# Patient Record
Sex: Female | Born: 1960 | Race: White | Hispanic: No | Marital: Married | Smoking: Never smoker
Health system: Southern US, Community
[De-identification: ages and names within clinical notes are randomized; demographics above are authoritative.]

## PROBLEM LIST (undated history)

## (undated) DIAGNOSIS — N393 Stress incontinence (female) (male): Secondary | ICD-10-CM

## (undated) DIAGNOSIS — R011 Cardiac murmur, unspecified: Secondary | ICD-10-CM

## (undated) DIAGNOSIS — Z8619 Personal history of other infectious and parasitic diseases: Secondary | ICD-10-CM

## (undated) HISTORY — DX: Cardiac murmur, unspecified: R01.1

## (undated) HISTORY — PX: NO PAST SURGERIES: SHX2092

## (undated) HISTORY — DX: Personal history of other infectious and parasitic diseases: Z86.19

## (undated) HISTORY — DX: Stress incontinence (female) (male): N39.3

---

## 2007-12-07 ENCOUNTER — Ambulatory Visit: Payer: Self-pay | Admitting: Unknown Physician Specialty

## 2009-10-29 ENCOUNTER — Ambulatory Visit: Payer: Self-pay | Admitting: Unknown Physician Specialty

## 2011-04-21 ENCOUNTER — Ambulatory Visit: Payer: Self-pay

## 2012-02-01 ENCOUNTER — Ambulatory Visit: Payer: Self-pay | Admitting: Family Medicine

## 2012-05-29 ENCOUNTER — Encounter: Payer: Self-pay | Admitting: Internal Medicine

## 2012-05-29 ENCOUNTER — Ambulatory Visit (INDEPENDENT_AMBULATORY_CARE_PROVIDER_SITE_OTHER): Payer: BC Managed Care – PPO | Admitting: Internal Medicine

## 2012-05-29 ENCOUNTER — Other Ambulatory Visit (HOSPITAL_COMMUNITY)
Admission: RE | Admit: 2012-05-29 | Discharge: 2012-05-29 | Disposition: A | Discharge status: Home / Self Care | Payer: BC Managed Care – PPO | Source: Ambulatory Visit | Attending: Internal Medicine | Admitting: Internal Medicine

## 2012-05-29 VITALS — BP 120/76 | HR 64 | Temp 98.4°F | Ht 66.0 in | Wt 138.5 lb

## 2012-05-29 DIAGNOSIS — Z806 Family history of leukemia: Secondary | ICD-10-CM

## 2012-05-29 DIAGNOSIS — R32 Unspecified urinary incontinence: Secondary | ICD-10-CM

## 2012-05-29 DIAGNOSIS — R5381 Other malaise: Secondary | ICD-10-CM

## 2012-05-29 DIAGNOSIS — Z13220 Encounter for screening for lipoid disorders: Secondary | ICD-10-CM

## 2012-05-29 DIAGNOSIS — Z1211 Encounter for screening for malignant neoplasm of colon: Secondary | ICD-10-CM

## 2012-05-29 DIAGNOSIS — Z1239 Encounter for other screening for malignant neoplasm of breast: Secondary | ICD-10-CM

## 2012-05-29 DIAGNOSIS — R5383 Other fatigue: Secondary | ICD-10-CM

## 2012-05-29 DIAGNOSIS — Z01419 Encounter for gynecological examination (general) (routine) without abnormal findings: Secondary | ICD-10-CM | POA: Insufficient documentation

## 2012-05-29 DIAGNOSIS — Z0000 Encounter for general adult medical examination without abnormal findings: Secondary | ICD-10-CM

## 2012-05-29 LAB — POCT URINALYSIS DIPSTICK
Glucose, UA: NEGATIVE
Nitrite, UA: NEGATIVE
Protein, UA: NEGATIVE
Spec Grav, UA: 1.02
Urobilinogen, UA: 0.2

## 2012-05-29 NOTE — Progress Notes (Signed)
Patient ID: Lucilla Lame, female   DOB: October 24, 1960, 51 y.o.   MRN: [id number]  Subjective:    Marylen Zuk is a 51 y.o. female who presents for an annual exam. The patient has no complaints today. The patient is sexually active. GYN screening history: last pap: approximate date 2011 and was normal. The patient wears seatbelts: yes. The patient participates in regular exercise: yes. Has the patient ever been transfused or tattooed?: no. The patient reports that there is not domestic violence in her life.   Menstrual History: OB History    Grav Para Term Preterm Abortions TAB SAB Ect Mult Living                  Menarche age: 89 Patient's last menstrual period was 05/22/2012.    The following portions of the patient's history were reviewed and updated as appropriate: allergies, current medications, past family history, past medical history, past social history, past surgical history and problem list.  Review of Systems A comprehensive review of systems was negative.    Objective:    BP 120/76  Pulse 64  Temp 98.4 F (36.9 C) (Oral)  Ht 5\' 6"  (1.676 m)  Wt 138 lb 8 oz (62.823 kg)  BMI 22.35 kg/m2  LMP 05/22/2012  General Appearance:    Alert, cooperative, no distress, appears stated age  Head:    Normocephalic, without obvious abnormality, atraumatic  Eyes:    PERRL, conjunctiva/corneas clear, EOM's intact, fundi    benign, both eyes  Ears:    Normal TM's and external ear canals, both ears  Nose:   Nares normal, septum midline, mucosa normal, no drainage    or sinus tenderness  Throat:   Lips, mucosa, and tongue normal; teeth and gums normal  Neck:   Supple, symmetrical, trachea midline, no adenopathy;    thyroid:  no enlargement/tenderness/nodules; no carotid   bruit or JVD  Back:     Symmetric, no curvature, ROM normal, no CVA tenderness  Lungs:     Clear to auscultation bilaterally, respirations unlabored  Chest Wall:    No tenderness or deformity   Heart:    Regular  rate and rhythm, S1 and S2 normal, no murmur, rub   or gallop  Breast Exam:    No tenderness, masses, or nipple abnormality  Abdomen:     Soft, non-tender, bowel sounds active all four quadrants,    no masses, no organomegaly  Genitalia:    Normal female without lesion, discharge or tenderness  Rectal:    Normal tone, normal prostate, no masses or tenderness;   guaiac negative stool  Extremities:   Extremities normal, atraumatic, no cyanosis or edema  Pulses:   2+ and symmetric all extremities  Skin:   Skin color, texture, turgor normal, no rashes or lesions  Lymph nodes:   Cervical, supraclavicular, and axillary nodes normal  Neurologic:   CNII-XII intact, normal strength, sensation and reflexes    throughout  .    Assessment:    Healthy female exam.    Plan:     Await pap smear results. Blood tests: CBC with diff, Comprehensive metabolic panel, Lipoproteins and TSH. Breast self exam technique reviewed and patient encouraged to perform self-exam monthly. Follow up in 1 year.

## 2012-05-29 NOTE — Patient Instructions (Signed)
Please make an appointment to return for fasting blood work. We will call you with the results of your Pap smear.  A referral for screening colonoscopy is underway.  Your mammogram has been ordered.

## 2012-05-30 LAB — URINALYSIS
Leukocytes, UA: NEGATIVE
Nitrite: NEGATIVE
Specific Gravity, Urine: 1.015 (ref 1.000–1.030)
Urobilinogen, UA: 0.2 (ref 0.0–1.0)
pH: 6 (ref 5.0–8.0)

## 2012-06-05 ENCOUNTER — Encounter: Payer: Self-pay | Admitting: Family Medicine

## 2012-06-05 ENCOUNTER — Other Ambulatory Visit (INDEPENDENT_AMBULATORY_CARE_PROVIDER_SITE_OTHER): Payer: BC Managed Care – PPO

## 2012-06-05 DIAGNOSIS — R5381 Other malaise: Secondary | ICD-10-CM

## 2012-06-05 DIAGNOSIS — Z806 Family history of leukemia: Secondary | ICD-10-CM

## 2012-06-05 DIAGNOSIS — Z13220 Encounter for screening for lipoid disorders: Secondary | ICD-10-CM

## 2012-06-05 DIAGNOSIS — R5383 Other fatigue: Secondary | ICD-10-CM

## 2012-06-05 LAB — COMPREHENSIVE METABOLIC PANEL
ALT: 16 U/L (ref 0–35)
AST: 19 U/L (ref 0–37)
Albumin: 4 g/dL (ref 3.5–5.2)
CO2: 25 mEq/L (ref 19–32)
Calcium: 9.2 mg/dL (ref 8.4–10.5)
Chloride: 102 mEq/L (ref 96–112)
GFR: 114.49 mL/min (ref >60.00)
Potassium: 4 mEq/L (ref 3.5–5.1)
Sodium: 136 mEq/L (ref 135–145)
Total Protein: 6.7 g/dL (ref 6.0–8.3)

## 2012-06-05 LAB — CBC WITH DIFFERENTIAL/PLATELET
Basophils Relative: 0.6 % (ref 0.0–3.0)
HCT: 37.5 % (ref 36.0–46.0)
Hemoglobin: 12.5 g/dL (ref 12.0–15.0)
Lymphocytes Relative: 32.7 % (ref 12.0–46.0)
Lymphs Abs: 1.3 10*3/uL (ref 0.7–4.0)
Monocytes Relative: 7.1 % (ref 3.0–12.0)
Neutro Abs: 2.3 10*3/uL (ref 1.4–7.7)
RBC: 3.88 Mil/uL (ref 3.87–5.11)
RDW: 13.3 % (ref 11.5–14.6)

## 2012-06-05 LAB — LIPID PANEL
Cholesterol: 194 mg/dL (ref 0–200)
LDL Cholesterol: 78 mg/dL (ref 0–99)
Total CHOL/HDL Ratio: 2
Triglycerides: 37 mg/dL (ref 0.0–149.0)

## 2012-06-05 LAB — TSH: TSH: 1.04 u[IU]/mL (ref 0.35–5.50)

## 2012-07-05 ENCOUNTER — Ambulatory Visit: Payer: Self-pay | Admitting: Internal Medicine

## 2012-07-14 ENCOUNTER — Encounter: Payer: Self-pay | Admitting: Internal Medicine

## 2012-09-11 LAB — HM COLONOSCOPY: HM Colonoscopy: NORMAL

## 2012-09-13 ENCOUNTER — Ambulatory Visit: Payer: Self-pay | Admitting: General Surgery

## 2012-10-11 ENCOUNTER — Encounter: Payer: Self-pay | Admitting: Internal Medicine

## 2013-05-30 ENCOUNTER — Ambulatory Visit (INDEPENDENT_AMBULATORY_CARE_PROVIDER_SITE_OTHER): Payer: BC Managed Care – PPO | Admitting: Internal Medicine

## 2013-05-30 ENCOUNTER — Encounter: Payer: Self-pay | Admitting: Internal Medicine

## 2013-05-30 VITALS — BP 112/72 | HR 70 | Temp 98.6°F | Resp 14 | Ht 66.1 in | Wt 141.2 lb

## 2013-05-30 DIAGNOSIS — R5381 Other malaise: Secondary | ICD-10-CM

## 2013-05-30 DIAGNOSIS — Z23 Encounter for immunization: Secondary | ICD-10-CM

## 2013-05-30 DIAGNOSIS — N63 Unspecified lump in unspecified breast: Secondary | ICD-10-CM

## 2013-05-30 DIAGNOSIS — Z0000 Encounter for general adult medical examination without abnormal findings: Secondary | ICD-10-CM

## 2013-05-30 DIAGNOSIS — R635 Abnormal weight gain: Secondary | ICD-10-CM

## 2013-05-30 LAB — COMPREHENSIVE METABOLIC PANEL
ALT: 16 U/L (ref 0–35)
BUN: 19 mg/dL (ref 6–23)
CO2: 26 mEq/L (ref 19–32)
Calcium: 9.7 mg/dL (ref 8.4–10.5)
Chloride: 102 mEq/L (ref 96–112)
Creatinine, Ser: 0.6 mg/dL (ref 0.4–1.2)
GFR: 111.85 mL/min (ref >60.00)
Glucose, Bld: 100 mg/dL — ABNORMAL HIGH (ref 70–99)
Total Bilirubin: 0.9 mg/dL (ref 0.3–1.2)

## 2013-05-30 LAB — CBC WITH DIFFERENTIAL/PLATELET
Basophils Relative: 0.5 % (ref 0.0–3.0)
Eosinophils Absolute: 0 10*3/uL (ref 0.0–0.7)
Eosinophils Relative: 0.9 % (ref 0.0–5.0)
HCT: 37.4 % (ref 36.0–46.0)
Lymphocytes Relative: 26.2 % (ref 12.0–46.0)
Lymphs Abs: 1.4 10*3/uL (ref 0.7–4.0)
MCHC: 34.2 g/dL (ref 30.0–36.0)
MCV: 95.7 fl (ref 78.0–100.0)
Monocytes Absolute: 0.4 10*3/uL (ref 0.1–1.0)
Platelets: 247 10*3/uL (ref 150.0–400.0)
RDW: 13.2 % (ref 11.5–14.6)
WBC: 5.2 10*3/uL (ref 4.5–10.5)

## 2013-05-30 LAB — TSH: TSH: 0.79 u[IU]/mL (ref 0.35–5.50)

## 2013-05-30 NOTE — Patient Instructions (Addendum)
You had your annual wellness exam today  We will schedule your mammogram today  You received the  TDaP vaccine   We will contact you with the bloodwork results  This is  myversion of a  "Low GI"  Diet:      All of the foods can be found at grocery stores and in bulk at [employer].  The Atkins protein bars and shakes are available in more varieties at Target, WalMart and [employer]'s Foods.     7 AM Breakfast:  Choose from the following:  Low carbohydrate Protein  Shakes (I recommend the EAS AdvantEdge "Carb Control" shakes  Or the low carb shakes by Atkins.    2.5 carbs   Arnold's "Sandwhich Thin"toasted  w/ peanut butter (no jelly: about 20 net carbs  "Bagel Thin" with cream cheese and salmon: about 20 carbs   a scrambled egg/bacon/cheese burrito made with Mission's "carb balance" whole wheat tortilla  (about 10 net carbs )   Avoid cereal and bananas, oatmeal and cream of wheat and grits. They are loaded with carbohydrates!   10 AM: high protein snack  Protein bar by Atkins (the snack size, under 200 cal, usually < 6 net carbs).    A stick of cheese:  Around 1 carb,  100 cal     Dannon Light n Fit [place] Yogurt  (80 cal, 8 carbs)  Other so called "protein bars" and Greek yogurts tend to be loaded with carbohydrates.  Remember, in food advertising, the word "energy" is synonymous for " carbohydrate."  Lunch:   A Sandwich using the bread choices listed, Can use any  Eggs,  lunchmeat, grilled meat or canned tuna), avocado, regular mayo/mustard  and cheese.  A Salad using blue cheese, ranch,  Goddess or vinagrette,  No croutons or "confetti" and no "candied nuts" but regular nuts OK.   No pretzels or chips.  Pickles and miniature sweet peppers are a good low carb alternative that provide a "crunch"  The bread is the only source of carbohydrate in a sandwich and  can be decreased by trying some of these alternatives to traditional loaf bread  Joseph's makes a pita bread and a flat bread that  are 50 cal and 4 net carbs available at BJs and WalMart.  This can be toasted to use with hummous as well  Toufayan makes a low carb flatbread that's 100 cal and 9 net carbs available at [employer] and [employer] makes 2 sizes of  Low carb whole wheat tortilla  (The large one is 210 cal and 6 net carbs) Avoid "Low fat dressings, as well as Reyne Dumas and [address] dressings They are loaded with sugar!   3 PM/ Mid day  Snack:  Consider  1 ounce of  almonds, walnuts, pistachios, pecans, peanuts,  Macadamia nuts or a nut medley.  Avoid "granola"; the dried cranberries and raisins are loaded with carbohydrates. Mixed nuts as long as there are no raisins,  cranberries or dried fruit.     6 PM  Dinner:     Meat/fowl/fish with a green salad, and either broccoli, cauliflower, green beans, spinach, brussel sprouts or  Lima beans. DO NOT BREAD THE PROTEIN!!      There is a low carb pasta by Dreamfield's that is acceptable and tastes great: only 5 digestible carbs/serving.( All grocery stores but BJs carry it )  Try Kai Levins Angelo's chicken piccata or chicken or eggplant parm over low carb pasta.(Lowes and BJs)  Clifton Custard Sanchez's "Carnitas" (pulled pork, no sauce,  0 carbs) or his beef pot roast to make a dinner burrito (at BJ's)  Pesto over low carb pasta (bj's sells a good quality pesto in the center refrigerated section of the deli   Whole wheat pasta is still full of digestible carbs and  Not as low in glycemic index as Dreamfield's.   Brown rice is still rice,  So skip the rice and noodles if you eat [place] or [place] (or at least limit to 1/2 cup)  9 PM snack :   Breyer's "low carb" fudgsicle or  ice cream bar (Carb Smart line), or  Weight Watcher's ice cream bar , or another "no sugar added" ice cream;  a serving of fresh berries/cherries with whipped cream   Cheese or DANNON'S LlGHT N FIT [place] YOGURT  Avoid bananas, pineapple, grapes  and watermelon on a regular basis because they are high  in sugar.  THINK OF THEM AS DESSERT  Remember that snack Substitutions should be less than 10 NET carbs per serving and meals < 20 carbs. Remember to subtract fiber grams to get the "net carbs."

## 2013-05-30 NOTE — Progress Notes (Signed)
Patient ID: Nancy Villegas, female   DOB: 1961/07/02, 52 y.o.   MRN: [id number]   Subjective:     Nancy Villegas is a 52 y.o. female and is here for a comprehensive physical exam. The patient reports. Annual exam.  Chief complaint today is dissatisfaction with her weight and body shape despite regular exercise and healthy diet. She states that her periods have been occurring regularly. She has begun to have occasional hot flashes and night sweats. She is running 30 minutes 5 days a week and using weight training 4 days a week. She exercises daily. He follows what she considers to be a low glycemic index diet.        History   Social History  . Marital Status: Married    Spouse Name: N/A    Number of Children: N/A  . Years of Education: N/A   Occupational History  . Not on file.   Social History Main Topics  . Smoking status: Never Smoker   . Smokeless tobacco: Not on file  . Alcohol Use: Yes     Comment: Regular (1 beer/daily)  . Drug Use: No  . Sexual Activity: Not on file   Other Topics Concern  . Not on file   Social History Narrative  . No narrative on file   Health Maintenance  Topic Date Due  . Influenza Vaccine  04/06/2014  . Mammogram  07/05/2014  . Pap Smear  05/30/2015  . Colonoscopy  09/13/2022  . Tetanus/tdap  05/31/2023    The following portions of the patient's history were reviewed and updated as appropriate: allergies, current medications, past family history, past medical history, past social history, past surgical history and problem list.  Review of Systems A comprehensive review of systems was negative.   Objective:  BP 112/72  Pulse 70  Temp(Src) 98.6 F (37 C) (Oral)  Resp 14  Ht 5' 6.1" (1.679 m)  Wt 141 lb 4 oz (64.071 kg)  BMI 22.73 kg/m2  SpO2 99%  LMP 05/09/2013  General Appearance:    Alert, cooperative, no distress, appears stated age  Head:    Normocephalic, without obvious abnormality, atraumatic  Eyes:    PERRL,  conjunctiva/corneas clear, EOM's intact, fundi    benign, both eyes  Ears:    Normal TM's and external ear canals, both ears  Nose:   Nares normal, septum midline, mucosa normal, no drainage    or sinus tenderness  Throat:   Lips, mucosa, and tongue normal; teeth and gums normal  Neck:   Supple, symmetrical, trachea midline, no adenopathy;    thyroid:  no enlargement/tenderness/nodules; no carotid   bruit or JVD  Back:     Symmetric, no curvature, ROM normal, no CVA tenderness  Lungs:     Clear to auscultation bilaterally, respirations unlabored  Chest Wall:    No tenderness or deformity   Heart:    Regular rate and rhythm, S1 and S2 normal, no murmur, rub   or gallop  Breast Exam:    Breasts are dense and lumpy,  Pea sized nodule at 5:00 postion left breast noted. No dimpling or nipple abnormality  Abdomen:     Soft, non-tender, bowel sounds active all four quadrants,    no masses, no organomegaly     Extremities:   Extremities normal, atraumatic, no cyanosis or edema  Pulses:   2+ and symmetric all extremities  Skin:   Skin color, texture, turgor normal, no rashes or lesions  Lymph nodes:  Cervical, supraclavicular, and axillary nodes normal  Neurologic:   CNII-XII intact, normal strength, sensation and reflexes    throughout    Assessment:    Well woman exam (no gynecological exam) Annual comprehensive exam was done including breas exam.  Pelvic and PAP were normal in 2013 and not repeated .  All screenings have been addressed .   Breast mass in female She has a pea-sized mass in the right breast at the 5:00 position that is not present on the left. I am ordering her diagnostic mammogram today.   Updated Medication List No outpatient encounter prescriptions on file as of 05/30/2013.   No facility-administered encounter medications on file as of 05/30/2013.

## 2013-05-31 ENCOUNTER — Encounter: Payer: Self-pay | Admitting: Internal Medicine

## 2013-05-31 DIAGNOSIS — Z0000 Encounter for general adult medical examination without abnormal findings: Secondary | ICD-10-CM | POA: Insufficient documentation

## 2013-05-31 DIAGNOSIS — N63 Unspecified lump in unspecified breast: Secondary | ICD-10-CM | POA: Insufficient documentation

## 2013-05-31 NOTE — Assessment & Plan Note (Signed)
Annual comprehensive exam was done including breas exam.  Pelvic and PAP were normal in 2013 and not repeated .  All screenings have been addressed .

## 2013-05-31 NOTE — Assessment & Plan Note (Signed)
She has a pea-sized mass in the right breast at the 5:00 position that is not present on the left. I am ordering her diagnostic mammogram today.

## 2013-06-04 NOTE — Telephone Encounter (Signed)
Mailed unread message to pt  

## 2013-06-08 ENCOUNTER — Ambulatory Visit: Payer: Self-pay | Admitting: Internal Medicine

## 2013-06-23 ENCOUNTER — Encounter: Payer: Self-pay | Admitting: Internal Medicine

## 2013-07-09 ENCOUNTER — Telehealth: Payer: Self-pay | Admitting: *Deleted

## 2013-07-09 NOTE — Telephone Encounter (Signed)
Patient called to schedule a visit to discuss Hot flashes.

## 2013-07-13 ENCOUNTER — Encounter: Payer: Self-pay | Admitting: Internal Medicine

## 2013-07-13 ENCOUNTER — Ambulatory Visit (INDEPENDENT_AMBULATORY_CARE_PROVIDER_SITE_OTHER): Payer: BC Managed Care – PPO | Admitting: Internal Medicine

## 2013-07-13 VITALS — BP 126/68 | HR 80 | Temp 98.1°F | Resp 12 | Ht 66.0 in | Wt 137.2 lb

## 2013-07-13 DIAGNOSIS — N959 Unspecified menopausal and perimenopausal disorder: Secondary | ICD-10-CM

## 2013-07-13 DIAGNOSIS — N951 Menopausal and female climacteric states: Secondary | ICD-10-CM

## 2013-07-13 MED ORDER — PROGESTERONE MICRONIZED 200 MG PO CAPS: 200.0000 mg | ORAL_CAPSULE | Freq: Every day | ORAL | Status: DC

## 2013-07-13 MED ORDER — ESTRADIOL 0.1 MG/24HR TD PTTW: 1.0000 | MEDICATED_PATCH | TRANSDERMAL | Status: DC

## 2013-07-13 NOTE — Patient Instructions (Signed)
I am prescribing HRT (hormone replacement therapy) for your hot flashes  You have been given samples of the minivelle patch which contains only estrogen You will need to take Prometrium,   The progesterone pill,  In addition to wearing the patch,  For 12 days out of every month to prevent endometrial hyperplasia which increases your risk for endometrial cancer

## 2013-07-13 NOTE — Progress Notes (Signed)
Patient ID: Nancy Villegas, female   DOB: [birth year], 52 y.o.   MRN: [id number]  Patient Active Problem List   Diagnosis Date Noted  . Menopausal hot flushes 07/14/2013  . Well woman exam (no gynecological exam) 05/31/2013    Subjective:  CC:   Chief Complaint  Patient presents with  . Acute Visit    Hot flashes    HPI:   Nancy Horrell. Parkeris a 52 y.o. female who presents with hot flashes,  Having at least 5 per night,  Disrupting her sleep.  She has been miserable for the last 2 months,  Last menses was 2 months ago.  No contraindications ot HRT and wants to try HRT>  Friend recommended minivelle transdermal.    Past Medical History  Diagnosis Date  . History of chicken pox   . Heart murmur   . Urine, incontinence, stress female     leaking with laughter or very full bladder    No past surgical history on file.     The following portions of the patient's history were reviewed and updated as appropriate: Allergies, current medications, and problem list.    Review of Systems:   12 Pt  review of systems was negative except those addressed in the HPI,     History   Social History  . Marital Status: Married    Spouse Name: N/A    Number of Children: N/A  . Years of Education: N/A   Occupational History  . Not on file.   Social History Main Topics  . Smoking status: Never Smoker   . Smokeless tobacco: Not on file  . Alcohol Use: Yes     Comment: Regular (1 beer/daily)  . Drug Use: No  . Sexual Activity: Not on file   Other Topics Concern  . Not on file   Social History Narrative  . No narrative on file    Objective:  Filed Vitals:   07/13/13 1118  BP: 126/68  Pulse: 80  Temp: 98.1 F (36.7 C)  Resp: 12     General appearance: alert, cooperative and appears stated age Ears: normal TM's and external ear canals both ears Throat: lips, mucosa, and tongue normal; teeth and gums normal Neck: no adenopathy, no carotid bruit, supple, symmetrical,  trachea midline and thyroid not enlarged, symmetric, no tenderness/mass/nodules Back: symmetric, no curvature. ROM normal. No CVA tenderness. Lungs: clear to auscultation bilaterally Heart: regular rate and rhythm, S1, S2 normal, no murmur, click, rub or gallop Abdomen: soft, non-tender; bowel sounds normal; no masses,  no organomegaly Pulses: 2+ and symmetric Skin: Skin color, texture, turgor normal. No rashes or lesions Lymph nodes: Cervical, supraclavicular, and axillary nodes normal.  Assessment and Plan:  Menopausal hot flushes The risks and benefits of HRT were discussed with patient, and alternatives to HRT were also discussed.  She is requesting HRT and has no relative or absolute C/I.  Samples of minivelle and oral progesterone for 12 days/month to prevent enodmetrial hyperplasia.     Updated Medication List Outpatient Encounter Prescriptions as of 07/13/2013  Medication Sig  . estradiol (MINIVELLE) 0.1 MG/24HR patch Place 1 patch (0.1 mg total) onto the skin 2 (two) times a week.  . progesterone (PROMETRIUM) 200 MG capsule Take 1 capsule (200 mg total) by mouth daily. In the evening for 12 days every month  . [DISCONTINUED] progesterone (PROMETRIUM) 200 MG capsule Take 1 capsule (200 mg total) by mouth daily. In the evening for 12 days every month  No orders of the defined types were placed in this encounter.    No Follow-up on file.

## 2013-07-13 NOTE — Progress Notes (Signed)
Pre-visit discussion using our clinic review tool. No additional management support is needed unless otherwise documented below in the visit note.  

## 2013-07-14 DIAGNOSIS — N951 Menopausal and female climacteric states: Secondary | ICD-10-CM | POA: Insufficient documentation

## 2013-07-14 NOTE — Assessment & Plan Note (Signed)
The risks and benefits of HRT were discussed with patient, and alternatives to HRT were also discussed.  She is requesting HRT and has no relative or absolute C/I.  Samples of minivelle and oral progesterone for 12 days/month to prevent enodmetrial hyperplasia.

## 2013-07-23 ENCOUNTER — Encounter: Payer: Self-pay | Admitting: Internal Medicine

## 2013-07-24 ENCOUNTER — Encounter: Payer: Self-pay | Admitting: Internal Medicine

## 2013-07-25 MED ORDER — ESTRADIOL 2 MG PO TABS: 2.0000 mg | ORAL_TABLET | Freq: Every day | ORAL | Status: DC

## 2013-07-25 NOTE — Addendum Note (Signed)
Addended by: Sherlene Shams on: 07/25/2013 06:55 AM   Modules accepted: Orders

## 2013-08-09 ENCOUNTER — Telehealth: Payer: Self-pay | Admitting: Internal Medicine

## 2013-08-09 ENCOUNTER — Encounter: Payer: Self-pay | Admitting: Internal Medicine

## 2013-08-09 NOTE — Telephone Encounter (Signed)
Pt states she needs insurance form filled out by tomorrow.  Did not realize tomorrow was the deadline.  Is faxing over form to be completed.  Would like a call to let her know the status, and if it could possibly be turned in tomorrow.

## 2013-08-09 NOTE — Telephone Encounter (Signed)
I have not seen this paper work unless it is [employer] from lunch.

## 2013-08-10 ENCOUNTER — Telehealth: Payer: Self-pay | Admitting: Internal Medicine

## 2013-08-10 DIAGNOSIS — Z13220 Encounter for screening for lipoid disorders: Secondary | ICD-10-CM

## 2013-08-10 NOTE — Telephone Encounter (Signed)
Faxed form without Lipids. Patient requesting fasting labs, does patient also need an appointment with you please advise.

## 2013-08-10 NOTE — Telephone Encounter (Signed)
Does not need appt.  Order for fasting lipids placed .  Make appt for labs only

## 2013-08-10 NOTE — Telephone Encounter (Signed)
Pt calling to check status of paperwork.  States she will have husband fax it again now.

## 2013-08-10 NOTE — Telephone Encounter (Signed)
Pt left vm.  Regarding paperwork, asking if you will fax it with the blank spots.  States they will ask insurance if they can complete the rest later.  States she would like to make an appt some time next week to complete what is needed to fill in the blanks.

## 2013-08-10 NOTE — Telephone Encounter (Signed)
Paper work needs current LDL and HDL cholesterol profile patient has not had Lipid drawn since 2013.  Patient notified this cannot be done due to no blood work

## 2013-08-10 NOTE — Telephone Encounter (Signed)
Pt dropped off biometric forms Need to be turned in today Took back to cathy

## 2013-08-13 NOTE — Telephone Encounter (Signed)
Patient scheduled for fasting labs 08/16/13

## 2013-08-15 NOTE — Telephone Encounter (Signed)
Holding paper work for labs. Paper work on top of Education officer, environmental in tray.

## 2013-08-16 ENCOUNTER — Other Ambulatory Visit: Payer: BC Managed Care – PPO

## 2013-09-21 NOTE — Telephone Encounter (Signed)
Form awaiting patient blood work

## 2013-12-15 ENCOUNTER — Other Ambulatory Visit: Payer: Self-pay | Admitting: Internal Medicine

## 2014-05-14 ENCOUNTER — Encounter: Payer: Self-pay | Admitting: Internal Medicine

## 2014-05-15 ENCOUNTER — Telehealth: Payer: Self-pay | Admitting: Internal Medicine

## 2014-05-15 NOTE — Telephone Encounter (Signed)
   I am closed to new patients,  but NOT to family members of established patients.  I'm happy to see  her husband as a new patient ; please call her back .  Regards,  Dr. Darrick Huntsman

## 2014-05-31 ENCOUNTER — Encounter: Payer: BC Managed Care – PPO | Admitting: Internal Medicine

## 2014-07-02 ENCOUNTER — Other Ambulatory Visit: Payer: Self-pay | Admitting: Internal Medicine

## 2014-07-02 NOTE — Telephone Encounter (Signed)
Appt 07/08/14 

## 2014-07-08 ENCOUNTER — Ambulatory Visit (INDEPENDENT_AMBULATORY_CARE_PROVIDER_SITE_OTHER): Payer: BC Managed Care – PPO | Admitting: *Deleted

## 2014-07-08 ENCOUNTER — Encounter: Payer: Self-pay | Admitting: Internal Medicine

## 2014-07-08 ENCOUNTER — Ambulatory Visit (INDEPENDENT_AMBULATORY_CARE_PROVIDER_SITE_OTHER): Payer: BC Managed Care – PPO | Admitting: Internal Medicine

## 2014-07-08 VITALS — BP 118/68 | HR 67 | Temp 98.6°F | Resp 16 | Ht 65.5 in | Wt 139.5 lb

## 2014-07-08 DIAGNOSIS — Z1239 Encounter for other screening for malignant neoplasm of breast: Secondary | ICD-10-CM

## 2014-07-08 DIAGNOSIS — N951 Menopausal and female climacteric states: Secondary | ICD-10-CM

## 2014-07-08 DIAGNOSIS — E785 Hyperlipidemia, unspecified: Secondary | ICD-10-CM

## 2014-07-08 DIAGNOSIS — R5383 Other fatigue: Secondary | ICD-10-CM

## 2014-07-08 DIAGNOSIS — Z0000 Encounter for general adult medical examination without abnormal findings: Secondary | ICD-10-CM

## 2014-07-08 DIAGNOSIS — Z23 Encounter for immunization: Secondary | ICD-10-CM

## 2014-07-08 DIAGNOSIS — E559 Vitamin D deficiency, unspecified: Secondary | ICD-10-CM

## 2014-07-08 DIAGNOSIS — Z1159 Encounter for screening for other viral diseases: Secondary | ICD-10-CM

## 2014-07-08 LAB — CBC WITH DIFFERENTIAL/PLATELET
Basophils Absolute: 0 10*3/uL (ref 0.0–0.1)
Basophils Relative: 0.5 % (ref 0.0–3.0)
EOS PCT: 0.4 % (ref 0.0–5.0)
Eosinophils Absolute: 0 10*3/uL (ref 0.0–0.7)
HEMATOCRIT: 35.6 % — AB (ref 36.0–46.0)
Hemoglobin: 12 g/dL (ref 12.0–15.0)
LYMPHS ABS: 1.7 10*3/uL (ref 0.7–4.0)
Lymphocytes Relative: 25.9 % (ref 12.0–46.0)
MCHC: 33.7 g/dL (ref 30.0–36.0)
MCV: 95.9 fl (ref 78.0–100.0)
MONO ABS: 0.4 10*3/uL (ref 0.1–1.0)
Monocytes Relative: 5.9 % (ref 3.0–12.0)
Neutro Abs: 4.4 10*3/uL (ref 1.4–7.7)
Neutrophils Relative %: 67.3 % (ref 43.0–77.0)
Platelets: 225 10*3/uL (ref 150.0–400.0)
RBC: 3.71 Mil/uL — ABNORMAL LOW (ref 3.87–5.11)
RDW: 12.8 % (ref 11.5–15.5)
WBC: 6.6 10*3/uL (ref 4.0–10.5)

## 2014-07-08 LAB — TSH: TSH: 0.9 u[IU]/mL (ref 0.35–4.50)

## 2014-07-08 LAB — COMPREHENSIVE METABOLIC PANEL
ALBUMIN: 3.4 g/dL — AB (ref 3.5–5.2)
ALT: 16 U/L (ref 0–35)
AST: 22 U/L (ref 0–37)
Alkaline Phosphatase: 33 U/L — ABNORMAL LOW (ref 39–117)
BUN: 16 mg/dL (ref 6–23)
CALCIUM: 9.3 mg/dL (ref 8.4–10.5)
CHLORIDE: 100 meq/L (ref 96–112)
CO2: 27 meq/L (ref 19–32)
Creatinine, Ser: 0.6 mg/dL (ref 0.4–1.2)
GFR: 103.37 mL/min (ref >60.00)
Glucose, Bld: 85 mg/dL (ref 70–99)
POTASSIUM: 4.7 meq/L (ref 3.5–5.1)
Sodium: 135 mEq/L (ref 135–145)
Total Bilirubin: 0.6 mg/dL (ref 0.2–1.2)
Total Protein: 6.4 g/dL (ref 6.0–8.3)

## 2014-07-08 LAB — VITAMIN D 25 HYDROXY (VIT D DEFICIENCY, FRACTURES): VITD: 36.73 ng/mL (ref 30.00–100.00)

## 2014-07-08 LAB — LIPID PANEL
Cholesterol: 194 mg/dL (ref 0–200)
HDL: 111.4 mg/dL (ref >39.00)
LDL Cholesterol: 70 mg/dL (ref 0–99)
NONHDL: 82.6
Total CHOL/HDL Ratio: 2
Triglycerides: 65 mg/dL (ref 0.0–149.0)
VLDL: 13 mg/dL (ref 0.0–40.0)

## 2014-07-08 MED ORDER — PROGESTERONE MICRONIZED 200 MG PO CAPS: 200.0000 mg | ORAL_CAPSULE | Freq: Every day | ORAL | Status: DC

## 2014-07-08 MED ORDER — ESTRADIOL 2 MG PO TABS: ORAL_TABLET | ORAL | Status: DC

## 2014-07-08 NOTE — Progress Notes (Signed)
Pre-visit discussion using our clinic review tool. No additional management support is needed unless otherwise documented below in the visit note.  

## 2014-07-08 NOTE — Patient Instructions (Signed)
You had your annual  wellness exam today.  We will repeat your PAP smear in 2016, sooner if needed   We will [facility]  .   You received the nfluenza vaccine today.  If you develop a sore arm , use tylenol and ibuprofen for a few days     We will contact you with the bloodwork results

## 2014-07-08 NOTE — Assessment & Plan Note (Signed)
Managed with HRT

## 2014-07-09 LAB — HEPATITIS C ANTIBODY: HCV Ab: NEGATIVE

## 2014-07-10 NOTE — Progress Notes (Signed)
Patient ID: Nancy Villegas, female   DOB: 09/17/1960, 53 y.o.   MRN: [id number]  Subjective:     Nancy Villegas is a 53 y.o. female and is here for a comprehensive physical exam. The patient reports no problems.  History   Social History  . Marital Status: Married    Spouse Name: N/A    Number of Children: N/A  . Years of Education: N/A   Occupational History  . Not on file.   Social History Main Topics  . Smoking status: Never Smoker   . Smokeless tobacco: Not on file  . Alcohol Use: Yes     Comment: Regular (1 beer/daily)  . Drug Use: No  . Sexual Activity: Not on file   Other Topics Concern  . Not on file   Social History Narrative   Health Maintenance  Topic Date Due  . INFLUENZA VACCINE  04/07/2015  . PAP SMEAR  05/30/2015  . MAMMOGRAM  06/09/2015  . COLONOSCOPY  09/13/2022  . TETANUS/TDAP  05/31/2023    The following portions of the patient's history were reviewed and updated as appropriate: allergies, current medications, past family history, past medical history, past social history, past surgical history and problem list.  Review of Systems A comprehensive review of systems was negative.   Objective:    BP 118/68 mmHg  Pulse 67  Temp(Src) 98.6 F (37 C) (Oral)  Resp 16  Ht 5' 5.5" (1.664 m)  Wt 139 lb 8 oz (63.277 kg)  BMI 22.85 kg/m2  SpO2 98%  LMP 06/20/2014 (Approximate)  General appearance: alert, cooperative and appears stated age Head: Normocephalic, without obvious abnormality, atraumatic Eyes: conjunctivae/corneas clear. PERRL, EOM's intact. Fundi benign. Ears: normal TM's and external ear canals both ears Nose: Nares normal. Septum midline. Mucosa normal. No drainage or sinus tenderness. Throat: lips, mucosa, and tongue normal; teeth and gums normal Neck: no adenopathy, no carotid bruit, no JVD, supple, symmetrical, trachea midline and thyroid not enlarged, symmetric, no tenderness/mass/nodules Lungs: clear to auscultation  bilaterally Breasts: normal appearance, no masses or tenderness Heart: regular rate and rhythm, S1, S2 normal, no murmur, click, rub or gallop Abdomen: soft, non-tender; bowel sounds normal; no masses,  no organomegaly Extremities: extremities normal, atraumatic, no cyanosis or edema Pulses: 2+ and symmetric Skin: Skin color, texture, turgor normal. No rashes or lesions Neurologic: Alert and oriented X 3, normal strength and tone. Normal symmetric reflexes. Normal coordination and gait.    Assessment and Plan:   Menopausal hot flushes Managed with HRT   Well woman exam (no gynecological exam) Annual wellness  exam was done as well as a comprehensive physical exam and management of acute and chronic conditions .  During the course of the visit the patient was educated and counseled about appropriate screening and preventive services including : fall prevention , diabetes screening, nutrition counseling, colorectal cancer screening, and recommended immunizations.  Printed recommendations for health maintenance screenings was given.    Updated Medication List Outpatient Encounter Prescriptions as of 07/08/2014  Medication Sig  . estradiol (ESTRACE) 2 MG tablet TAKE 1 TABLET (2 MG TOTAL) BY MOUTH DAILY.  . progesterone (PROMETRIUM) 200 MG capsule Take 1 capsule (200 mg total) by mouth daily. In the evening for 12 days every month  . [DISCONTINUED] estradiol (ESTRACE) 2 MG tablet TAKE 1 TABLET (2 MG TOTAL) BY MOUTH DAILY.  . [DISCONTINUED] progesterone (PROMETRIUM) 200 MG capsule Take 1 capsule (200 mg total) by mouth daily. In the evening for 12 days  every month

## 2014-07-10 NOTE — Assessment & Plan Note (Signed)
Annual  wellness  exam was done as well as a comprehensive physical exam and management of acute and chronic conditions .  During the course of the visit the patient was educated and counseled about appropriate screening and preventive services including : fall prevention , diabetes screening, nutrition counseling, colorectal cancer screening, and recommended immunizations.  Printed recommendations for health maintenance screenings was given.  

## 2014-07-11 ENCOUNTER — Encounter: Payer: Self-pay | Admitting: Internal Medicine

## 2014-07-18 ENCOUNTER — Encounter: Payer: Self-pay | Admitting: Internal Medicine

## 2014-08-05 ENCOUNTER — Ambulatory Visit
Admission: RE | Admit: 2014-08-05 | Discharge: 2014-08-05 | Disposition: A | Discharge status: Home / Self Care | Payer: BC Managed Care – PPO | Source: Ambulatory Visit | Attending: Internal Medicine | Admitting: Internal Medicine

## 2014-08-05 ENCOUNTER — Other Ambulatory Visit: Payer: Self-pay | Admitting: Internal Medicine

## 2014-08-05 DIAGNOSIS — Z1231 Encounter for screening mammogram for malignant neoplasm of breast: Secondary | ICD-10-CM

## 2014-11-08 ENCOUNTER — Encounter: Payer: Self-pay | Admitting: Internal Medicine

## 2014-11-08 ENCOUNTER — Ambulatory Visit (INDEPENDENT_AMBULATORY_CARE_PROVIDER_SITE_OTHER): Payer: BLUE CROSS/BLUE SHIELD | Admitting: Internal Medicine

## 2014-11-08 VITALS — BP 118/68 | HR 65 | Temp 98.7°F | Resp 14 | Ht 66.0 in | Wt 144.2 lb

## 2014-11-08 DIAGNOSIS — J0110 Acute frontal sinusitis, unspecified: Secondary | ICD-10-CM

## 2014-11-08 MED ORDER — PREDNISONE 10 MG PO TABS: ORAL_TABLET | ORAL | Status: DC

## 2014-11-08 MED ORDER — BENZONATATE 200 MG PO CAPS: 200.0000 mg | ORAL_CAPSULE | Freq: Three times a day (TID) | ORAL | Status: DC | PRN

## 2014-11-08 MED ORDER — HYDROCOD POLST-CHLORPHEN POLST 10-8 MG/5ML PO LQCR: 5.0000 mL | Freq: Every evening | ORAL | Status: DC | PRN

## 2014-11-08 MED ORDER — LEVOFLOXACIN 500 MG PO TABS: 500.0000 mg | ORAL_TABLET | Freq: Every day | ORAL | Status: DC

## 2014-11-08 NOTE — Progress Notes (Signed)
Patient ID: Nancy MaskLeigh Hunt Snell, female   DOB: 05/06/1961, 54 y.o.   MRN: [id number]   Patient Active Problem List   Diagnosis Date Noted  . Sinusitis, acute frontal 11/08/2014  . Menopausal hot flushes 07/14/2013  . Well woman exam (no gynecological exam) 05/31/2013    Subjective:  CC:   Chief Complaint  Patient presents with  . Cough    X 8 days congested green mucus, and chills. Weak no enery and headache.    HPI:   Nancy Villegas is a 54 y.o. female who presents for   8 day history of sinus pressure,  purulent nasal drainage, and worsening facial pain. Malaise,  Ache,s  No fevers.  Vomiting or diarrhea,  Flushing sinuses ,  Taking otc decongestants without improvement. Coughing a lot at night.     Past Medical History  Diagnosis Date  . History of chicken pox   . Heart murmur   . Urine, incontinence, stress female     leaking with laughter or very full bladder    No past surgical history on file.     The following portions of the patient's history were reviewed and updated as appropriate: Allergies, current medications, and problem list.    Review of Systems:   Patient denies headache, fevers, malaise, unintentional weight loss, skin rash, eye pain, sinus congestion and sinus pain, sore throat, dysphagia,  hemoptysis , cough, dyspnea, wheezing, chest pain, palpitations, orthopnea, edema, abdominal pain, nausea, melena, diarrhea, constipation, flank pain, dysuria, hematuria, urinary  Frequency, nocturia, numbness, tingling, seizures,  Focal weakness, Loss of consciousness,  Tremor, insomnia, depression, anxiety, and suicidal ideation.     History   Social History  . Marital Status: Married    Spouse Name: N/A  . Number of Children: N/A  . Years of Education: N/A   Occupational History  . Not on file.   Social History Main Topics  . Smoking status: Never Smoker   . Smokeless tobacco: Not on file  . Alcohol Use: Yes     Comment: Regular (1 beer/daily)   . Drug Use: No  . Sexual Activity: Not on file   Other Topics Concern  . Not on file   Social History Narrative    Objective:  Filed Vitals:   11/08/14 0921  BP: 118/68  Pulse: 65  Temp: 98.7 F (37.1 C)  Resp: 14     General appearance: alert, cooperative and appears stated age Ears: normal TM's and external ear canals both ears Throat: lips, mucosa, and tongue normal; teeth and gums normal Neck: no adenopathy, no carotid bruit, supple, symmetrical, trachea midline and thyroid not enlarged, symmetric, no tenderness/mass/nodules Back: symmetric, no curvature. ROM normal. No CVA tenderness. Lungs: clear to auscultation bilaterally Heart: regular rate and rhythm, S1, S2 normal, no murmur, click, rub or gallop Abdomen: soft, non-tender; bowel sounds normal; no masses,  no organomegaly Pulses: 2+ and symmetric Skin: Skin color, texture, turgor normal. No rashes or lesions Lymph nodes: Cervical, supraclavicular, and axillary nodes normal.  Assessment and Plan:  Sinusitis, acute frontal Given chronicity of symptoms, development of facial pain and exam consistent with bacterial URI,  Will treat with empiric antibiotics, prednisone taper, decongestants, and saline lavage.       Updated Medication List Outpatient Encounter Prescriptions as of 11/08/2014  Medication Sig  . dextromethorphan-guaiFENesin (MUCINEX DM) 30-600 MG per 12 hr tablet Take 1 tablet by mouth 2 (two) times daily.  Marland Kitchen. estradiol (ESTRACE) 2 MG tablet TAKE 1 TABLET (2  MG TOTAL) BY MOUTH DAILY.  Marland Kitchen ibuprofen (ADVIL,MOTRIN) 200 MG tablet Take 200 mg by mouth every 6 (six) hours as needed.  . progesterone (PROMETRIUM) 200 MG capsule Take 1 capsule (200 mg total) by mouth daily. In the evening for 12 days every month  . Pseudoeph-Doxylamine-DM-APAP (NYQUIL PO) Take 1 oz by mouth at bedtime.  . pseudoephedrine (SUDAFED) 120 MG 12 hr tablet Take 120 mg by mouth daily as needed for congestion.  . benzonatate (TESSALON)  200 MG capsule Take 1 capsule (200 mg total) by mouth 3 (three) times daily as needed for cough.  . chlorpheniramine-HYDROcodone (TUSSIONEX) 10-8 MG/5ML LQCR Take 5 mLs by mouth at bedtime as needed for cough.  Marland Kitchen levofloxacin (LEVAQUIN) 500 MG tablet Take 1 tablet (500 mg total) by mouth daily.  . predniSONE (DELTASONE) 10 MG tablet 6 tablets on Day 1 , then reduce by 1 tablet daily until gone     No orders of the defined types were placed in this encounter.    No Follow-up on file.

## 2014-11-08 NOTE — Patient Instructions (Signed)
I am treating you for bacterial sinusitis which is ca complication from your viral infection due to  persistent sinus congestion.   I am prescribing an antibiotic (levaquin) and a prednisone taper  To manage the infection and the inflammation in your ear/sinuses.   I also advise use of the following OTC meds to help with your other symptoms.   Take generic OTC benadryl 25 mg every 8 hours for the drainage,  Sudafed if needed for congestion,   flush your sinuses twice daily with NeilMed sinus rinse  (do over the sink because if you do it right you will spit out globs of mucus)  Use benzonatate capsules   FOR THE DAYTIME COUGH.  Use the tussionex liquid cough syrup at night fOr severe cough or headache.  It has vicodin in it so DO NOT COMBINE WITH ALCOHOL OR SHARE  WITH OTHERS  Please take a probiotic ( Align, Floraque or Culturelle) OR A GENERIC EQUIVALENT for three weeks since you are taking an  antibiotic to prevent a very serious antibiotic associated infection  Called closttidium dificile colitis that can cause diarrhea, multi organ failure, sepsis and death if not managed.

## 2014-11-10 ENCOUNTER — Encounter: Payer: Self-pay | Admitting: Internal Medicine

## 2014-11-10 NOTE — Assessment & Plan Note (Signed)
Given chronicity of symptoms, development of facial pain and exam consistent with bacterial URI,  Will treat with empiric antibiotics, prednisone taper, decongestants, and saline lavage.    

## 2014-11-19 ENCOUNTER — Encounter: Payer: Self-pay | Admitting: Internal Medicine

## 2014-12-05 ENCOUNTER — Other Ambulatory Visit: Payer: Self-pay | Admitting: Internal Medicine

## 2014-12-10 NOTE — Telephone Encounter (Signed)
Mailed patient copy of Unread message .

## 2014-12-13 NOTE — Telephone Encounter (Signed)
Mailed Patient a copy of unread My Chart Message on 12/13/2014.  

## 2015-01-07 ENCOUNTER — Other Ambulatory Visit: Payer: Self-pay | Admitting: Internal Medicine

## 2015-07-11 ENCOUNTER — Encounter: Payer: Self-pay | Admitting: Internal Medicine

## 2015-07-11 ENCOUNTER — Other Ambulatory Visit: Payer: Self-pay | Admitting: Internal Medicine

## 2015-07-11 ENCOUNTER — Ambulatory Visit (INDEPENDENT_AMBULATORY_CARE_PROVIDER_SITE_OTHER): Payer: BLUE CROSS/BLUE SHIELD | Admitting: Internal Medicine

## 2015-07-11 ENCOUNTER — Other Ambulatory Visit (HOSPITAL_COMMUNITY)
Admission: RE | Admit: 2015-07-11 | Discharge: 2015-07-11 | Disposition: A | Discharge status: Home / Self Care | Payer: BLUE CROSS/BLUE SHIELD | Source: Ambulatory Visit | Attending: Internal Medicine | Admitting: Internal Medicine

## 2015-07-11 VITALS — BP 126/78 | HR 56 | Temp 98.3°F | Ht 65.5 in | Wt 139.1 lb

## 2015-07-11 DIAGNOSIS — Z13220 Encounter for screening for lipoid disorders: Secondary | ICD-10-CM

## 2015-07-11 DIAGNOSIS — Z0000 Encounter for general adult medical examination without abnormal findings: Secondary | ICD-10-CM

## 2015-07-11 DIAGNOSIS — Z1151 Encounter for screening for human papillomavirus (HPV): Secondary | ICD-10-CM | POA: Insufficient documentation

## 2015-07-11 DIAGNOSIS — R300 Dysuria: Secondary | ICD-10-CM

## 2015-07-11 DIAGNOSIS — Z113 Encounter for screening for infections with a predominantly sexual mode of transmission: Secondary | ICD-10-CM

## 2015-07-11 DIAGNOSIS — N760 Acute vaginitis: Secondary | ICD-10-CM | POA: Diagnosis present

## 2015-07-11 DIAGNOSIS — Z131 Encounter for screening for diabetes mellitus: Secondary | ICD-10-CM

## 2015-07-11 DIAGNOSIS — Z01419 Encounter for gynecological examination (general) (routine) without abnormal findings: Secondary | ICD-10-CM | POA: Diagnosis present

## 2015-07-11 DIAGNOSIS — Z124 Encounter for screening for malignant neoplasm of cervix: Secondary | ICD-10-CM

## 2015-07-11 DIAGNOSIS — Z1239 Encounter for other screening for malignant neoplasm of breast: Secondary | ICD-10-CM

## 2015-07-11 DIAGNOSIS — N951 Menopausal and female climacteric states: Secondary | ICD-10-CM

## 2015-07-11 LAB — URINALYSIS, ROUTINE W REFLEX MICROSCOPIC
BILIRUBIN URINE: NEGATIVE
KETONES UR: NEGATIVE
Nitrite: NEGATIVE
PH: 5.5 (ref 5.0–8.0)
Specific Gravity, Urine: 1.01 (ref 1.000–1.030)
Total Protein, Urine: NEGATIVE
URINE GLUCOSE: NEGATIVE
UROBILINOGEN UA: 0.2 (ref 0.0–1.0)
WBC, UA: NONE SEEN (ref 0–?)

## 2015-07-11 LAB — COMPREHENSIVE METABOLIC PANEL
ALK PHOS: 39 U/L (ref 39–117)
ALT: 20 U/L (ref 0–35)
AST: 23 U/L (ref 0–37)
Albumin: 4.3 g/dL (ref 3.5–5.2)
BUN: 15 mg/dL (ref 6–23)
CO2: 27 meq/L (ref 19–32)
Calcium: 9.8 mg/dL (ref 8.4–10.5)
Chloride: 101 mEq/L (ref 96–112)
Creatinine, Ser: 0.57 mg/dL (ref 0.40–1.20)
GFR: 117.7 mL/min (ref >60.00)
GLUCOSE: 93 mg/dL (ref 70–99)
POTASSIUM: 3.9 meq/L (ref 3.5–5.1)
Sodium: 137 mEq/L (ref 135–145)
Total Bilirubin: 0.5 mg/dL (ref 0.2–1.2)
Total Protein: 7.2 g/dL (ref 6.0–8.3)

## 2015-07-11 LAB — POCT URINALYSIS DIPSTICK
BILIRUBIN UA: NEGATIVE
GLUCOSE UA: NEGATIVE
KETONES UA: NEGATIVE
Nitrite, UA: NEGATIVE
Protein, UA: NEGATIVE
SPEC GRAV UA: 1.01
Urobilinogen, UA: 0.2
pH, UA: 6

## 2015-07-11 MED ORDER — FLUCONAZOLE 150 MG PO TABS: 150.0000 mg | ORAL_TABLET | Freq: Every day | ORAL | Status: DC

## 2015-07-11 NOTE — Progress Notes (Signed)
Pre visit review using our clinic review tool, if applicable. No additional management support is needed unless otherwise documented below in the visit note. 

## 2015-07-11 NOTE — Patient Instructions (Addendum)
We  Discussed earning you off of estrogen today  You'll need no more than 30 estradiol to do the two month taper:  1/2 tablet daily for one month  1/2 tablet every other day for one month   Then stop  Continue the progesterone for 2 more months     fluconazole 150 mg daily for two days   We can try lexapro or effexor if the hot flashes are really annoying    The  diet I discussed with you today is the 10 day Green Smoothie Cleansing /Detox Diet by Brooke DareJJ Smith . available on [employer] for around $10.  This is not a low carb or a weight loss diet,  It is fundamentally a "cleansing" low fat diet that eliminates sugar, gluten, caffeine, alcohol and dairy for 10 days .  What you add back after the initial ten days is entirely up to  you!  You can expect to lose 5 to 10 lbs depending on how strict you are.   I found that  drinking 2 smoothies or juices  daily and keeping one chewable meal (but keep it simple, like baked fish and salad, rice or bok choy) kept me satisfied and kept me from straying  .  You snack primarily on fresh  fruit, egg whites and judicious quantities of nuts. I  Recommend adding a  vegetable based protein powder  To any smoothie made with almond milk.  (nothing with whey , since whey is dairy) in it.  [employer] has a Research officer, trade uniongreat selection .   It does require some form of a nutrient extractor (Vita Mix, a electric juicer,  Or a Nutribullet Rx).  i have found that using frozen fruits is much more convenient and cost effective. You can even find plenty of organic fruit in the frozen fruit section of [employer].  Just thaw what you need for the following day the night before in the refrigerator (to avoid jamming up your machine)    The organic greens drink I use if I don't have any fresh greens  is called "Suja" and it's sold in the vegetable refrigerated section of most grocery stores (including [employer])  . It is tart, though, so be careful (has lemon juice in it )  The organic vegan protein  powder I tried  is called Vega" and I found it at [employer] .  It is sugar free

## 2015-07-12 LAB — HIV ANTIBODY (ROUTINE TESTING W REFLEX): HIV 1&2 Ab, 4th Generation: NONREACTIVE

## 2015-07-12 LAB — URINE CULTURE
COLONY COUNT: NO GROWTH
ORGANISM ID, BACTERIA: NO GROWTH

## 2015-07-12 LAB — HEPATITIS C ANTIBODY: HCV AB: NEGATIVE

## 2015-07-13 ENCOUNTER — Encounter: Payer: Self-pay | Admitting: Internal Medicine

## 2015-07-13 DIAGNOSIS — Z0000 Encounter for general adult medical examination without abnormal findings: Secondary | ICD-10-CM | POA: Insufficient documentation

## 2015-07-13 NOTE — Assessment & Plan Note (Signed)
Gradual tapering of HRT discussed and outlined  to decrease patient's risk of thrombotic events.

## 2015-07-13 NOTE — Progress Notes (Signed)
Patient ID: Nancy Villegas, female    DOB: 05/03/1961  Age: 54 y.o. MRN: [id number]  The patient is here for annual  wellness examination and management of other chronic and acute problems.   The risk factors are reflected in the social history.  The roster of all physicians providing medical care to patient - is listed in the Snapshot section of the chart.  Home safety : The patient has smoke detectors in the home. They wear seatbelts.  There are no firearms at home. There is no violence in the home.   There is no risks for hepatitis, STDs or HIV. There is no   history of blood transfusion. They have no travel history to infectious disease endemic areas of the world.  The patient has seen their dentist in the last six month. They have seen their eye doctor in the last year. They admit to slight hearing difficulty with regard to whispered voices and some television programs.  They have deferred audiologic testing in the last year.  They do not  have excessive sun exposure. Discussed the need for sun protection: hats, long sleeves and use of sunscreen if there is significant sun exposure.   Diet: the importance of a healthy diet is discussed. They do have a healthy diet.  The benefits of regular aerobic exercise were discussed. She walks 4 times per week ,  20 minutes.   Depression screen: there are no signs or vegative symptoms of depression- irritability, change in appetite, anhedonia, sadness/tearfullness.  The following portions of the patient's history were reviewed and updated as appropriate: allergies, current medications, past family history, past medical history,  past surgical history, past social history  and problem list.  Visual acuity was not assessed per patient preference since she has regular follow up with her ophthalmologist. Hearing and body mass index were assessed and reviewed.   During the course of the visit the patient was educated and counseled about appropriate  screening and preventive services including : fall prevention , diabetes screening, nutrition counseling, colorectal cancer screening, and recommended immunizations.    CC: The primary encounter diagnosis was Breast cancer screening. Diagnoses of Dysuria, Screening for hyperlipidemia, Cervical cancer screening, Screen for STD (sexually transmitted disease), Screening for diabetes mellitus, Menopausal hot flushes, and Encounter for preventive health examination were also pertinent to this visit.  History Nancy Villegas has a past medical history of History of chicken pox; Heart murmur; and Urine, incontinence, stress female.   She has no past surgical history on file.   Her family history includes COPD in her father; Cancer in her father; Cancer (age of onset: 58) in her mother; Diabetes in her father; Ulcerative colitis in her mother.She reports that she has never smoked. She does not have any smokeless tobacco history on file. She reports that she drinks alcohol. She reports that she does not use illicit drugs.  Outpatient Prescriptions Prior to Visit  Medication Sig Dispense Refill  . estradiol (ESTRACE) 2 MG tablet TAKE 1 TABLET (2 MG TOTAL) BY MOUTH DAILY. 90 tablet 2  . ibuprofen (ADVIL,MOTRIN) 200 MG tablet Take 200 mg by mouth every 6 (six) hours as needed.    . progesterone (PROMETRIUM) 200 MG capsule Take 1 capsule (200 mg total) by mouth daily. In the evening for 12 days every month 36 capsule 3  . benzonatate (TESSALON) 200 MG capsule Take 1 capsule (200 mg total) by mouth 3 (three) times daily as needed for cough. 60 capsule 1  . chlorpheniramine-HYDROcodone (  TUSSIONEX) 10-8 MG/5ML LQCR Take 5 mLs by mouth at bedtime as needed for cough. 180 mL 0  . dextromethorphan-guaiFENesin (MUCINEX DM) 30-600 MG per 12 hr tablet Take 1 tablet by mouth 2 (two) times daily.    Marland Kitchen levofloxacin (LEVAQUIN) 500 MG tablet Take 1 tablet (500 mg total) by mouth daily. 7 tablet 0  . predniSONE (DELTASONE) 10 MG  tablet 6 tablets on Day 1 , then reduce by 1 tablet daily until gone 21 tablet 0  . Pseudoeph-Doxylamine-DM-APAP (NYQUIL PO) Take 1 oz by mouth at bedtime.    . pseudoephedrine (SUDAFED) 120 MG 12 hr tablet Take 120 mg by mouth daily as needed for congestion.     No facility-administered medications prior to visit.    Review of Systems   Patient denies headache, fevers, malaise, unintentional weight loss, skin rash, eye pain, sinus congestion and sinus pain, sore throat, dysphagia,  hemoptysis , cough, dyspnea, wheezing, chest pain, palpitations, orthopnea, edema, abdominal pain, nausea, melena, diarrhea, constipation, flank pain, dysuria, hematuria, urinary  Frequency, nocturia, numbness, tingling, seizures,  Focal weakness, Loss of consciousness,  Tremor, insomnia, depression, anxiety, and suicidal ideation.      Objective:  BP 126/78 mmHg  Pulse 56  Temp(Src) 98.3 F (36.8 C) (Oral)  Ht 5' 5.5" (1.664 m)  Wt 139 lb 2 oz (63.107 kg)  BMI 22.79 kg/m2  SpO2 99%  LMP 06/18/2015 (Approximate)  Physical Exam    General Appearance:    Alert, cooperative, no distress, appears stated age  Head:    Normocephalic, without obvious abnormality, atraumatic  Eyes:    PERRL, conjunctiva/corneas clear, EOM's intact, fundi    benign, both eyes  Ears:    Normal TM's and external ear canals, both ears  Nose:   Nares normal, septum midline, mucosa normal, no drainage    or sinus tenderness  Throat:   Lips, mucosa, and tongue normal; teeth and gums normal  Neck:   Supple, symmetrical, trachea midline, no adenopathy;    thyroid:  no enlargement/tenderness/nodules; no carotid   bruit or JVD  Back:     Symmetric, no curvature, ROM normal, no CVA tenderness  Lungs:     Clear to auscultation bilaterally, respirations unlabored  Chest Wall:    No tenderness or deformity   Heart:    Regular rate and rhythm, S1 and S2 normal, no murmur, rub   or gallop  Breast Exam:    No tenderness, masses, or  nipple abnormality  Abdomen:     Soft, non-tender, bowel sounds active all four quadrants,    no masses, no organomegaly  Genitalia:    Pelvic: cervix normal in appearance, external genitalia normal, no adnexal masses or tenderness, no cervical motion tenderness, rectovaginal septum normal, uterus normal size, shape, and consistency and vagina normal without discharge  Extremities:   Extremities normal, atraumatic, no cyanosis or edema  Pulses:   2+ and symmetric all extremities  Skin:   Skin color, texture, turgor normal, no rashes or lesions  Lymph nodes:   Cervical, supraclavicular, and axillary nodes normal  Neurologic:   CNII-XII intact, normal strength, sensation and reflexes    throughout        Assessment & Plan:   Problem List Items Addressed This Visit    Menopausal hot flushes    Gradual tapering of HRT discussed and outlined  to decrease patient's risk of thrombotic events.       Encounter for preventive health examination    Annual comprehensive preventive  exam was done as well as an evaluation and management of chronic conditions .  During the course of the visit the patient was educated and counseled about appropriate screening and preventive services including :  STD and diabetes screening,  colorectal cancer screening, and recommended immunizations.  Printed recommendations for health maintenance screenings was given.            Other Visit Diagnoses    Breast cancer screening    -  Primary    Relevant Orders    MM DIGITAL SCREENING BILATERAL    Dysuria        Relevant Orders    Urinalysis, Rout[facility] w [facility] ([facility]) (Completed)    Urine culture (Completed)    POCT urinalysis dipstick (Completed)    Screening for hyperlipidemia        Relevant Orders    Lipid panel    Cervical cancer screening        Relevant Orders    Cytology - PAP    Screen for STD (sexually transmitted disease)        Relevant Orders    Hepatitis C antibody  (Completed)    HIV antibody (Completed)    Screening for diabetes mellitus        Relevant Orders    Comprehensive metabolic panel       I have discontinued Ms. Lavone Neriarker's pseudoephedrine, dextromethorphan-guaiFENesin, Pseudoeph-Doxylamine-DM-APAP (NYQUIL PO), levofloxacin, predniSONE, benzonatate, and chlorpheniramine-HYDROcodone. I am also having her start on fluconazole. Additionally, I am having her maintain her progesterone, ibuprofen, and estradiol.  Meds ordered this encounter  Medications  . fluconazole (DIFLUCAN) 150 MG tablet    Sig: Take 1 tablet (150 mg total) by mouth daily.    Dispense:  2 tablet    Refill:  0    Medications Discontinued During This Encounter  Medication Reason  . benzonatate (TESSALON) 200 MG capsule Completed Course  . chlorpheniramine-HYDROcodone (TUSSIONEX) 10-8 MG/5ML LQCR Completed Course  . dextromethorphan-guaiFENesin (MUCINEX DM) 30-600 MG per 12 hr tablet Completed Course  . levofloxacin (LEVAQUIN) 500 MG tablet Error  . predniSONE (DELTASONE) 10 MG tablet Completed Course  . Pseudoeph-Doxylamine-DM-APAP (NYQUIL PO) Completed Course  . pseudoephedrine (SUDAFED) 120 MG 12 hr tablet Completed Course    Follow-up: No Follow-up on file.   Sherlene ShamsULLO, Nicolette Gieske L, MD

## 2015-07-13 NOTE — Assessment & Plan Note (Signed)
Annual comprehensive preventive exam was done as well as an evaluation and management of chronic conditions .  During the course of the visit the patient was educated and counseled about appropriate screening and preventive services including :  STD and diabetes screening,  colorectal cancer screening, and recommended immunizations.  Printed recommendations for health maintenance screenings was given.

## 2015-07-14 ENCOUNTER — Other Ambulatory Visit: Payer: Self-pay | Admitting: Internal Medicine

## 2015-07-14 ENCOUNTER — Encounter: Payer: Self-pay | Admitting: *Deleted

## 2015-07-14 ENCOUNTER — Telehealth: Payer: Self-pay | Admitting: Internal Medicine

## 2015-07-14 ENCOUNTER — Encounter: Payer: Self-pay | Admitting: Internal Medicine

## 2015-07-14 DIAGNOSIS — N760 Acute vaginitis: Secondary | ICD-10-CM | POA: Insufficient documentation

## 2015-07-14 LAB — LIPID PANEL
CHOL/HDL RATIO: 1.5 ratio (ref <=5.0)
CHOLESTEROL: 185 mg/dL (ref 125–200)
HDL: 120 mg/dL (ref >=46)
LDL CALC: 51 mg/dL (ref <130)
TRIGLYCERIDES: 70 mg/dL (ref <150)
VLDL: 14 mg/dL (ref <30)

## 2015-07-14 LAB — CYTOLOGY - PAP

## 2015-07-14 LAB — CERVICOVAGINAL ANCILLARY ONLY
Bacterial vaginitis: POSITIVE — AB
Candida vaginitis: POSITIVE — AB

## 2015-07-14 MED ORDER — METRONIDAZOLE 500 MG PO TABS: 500.0000 mg | ORAL_TABLET | Freq: Three times a day (TID) | ORAL | Status: DC

## 2015-07-14 NOTE — Telephone Encounter (Signed)
Health assessment forma has been completed and signed and is ready for pickup  .  Returned to [employer]Denisa in [employer] folder.

## 2015-07-14 NOTE — Telephone Encounter (Signed)
Patient returned my call and verbalized understanding of completed health assessment forms ready to pick up.  She acknowledges the $20.00 simple forms fee required.  Copy made and original placed in front office.

## 2015-07-17 ENCOUNTER — Encounter: Payer: Self-pay | Admitting: Internal Medicine

## 2015-08-02 ENCOUNTER — Other Ambulatory Visit: Payer: Self-pay | Admitting: Internal Medicine

## 2015-08-08 NOTE — Telephone Encounter (Signed)
Mailed unread message to patient.  

## 2015-08-13 ENCOUNTER — Ambulatory Visit
Admission: RE | Admit: 2015-08-13 | Discharge: 2015-08-13 | Disposition: A | Discharge status: Home / Self Care | Payer: BLUE CROSS/BLUE SHIELD | Source: Ambulatory Visit | Attending: Internal Medicine | Admitting: Internal Medicine

## 2015-08-13 ENCOUNTER — Other Ambulatory Visit: Payer: Self-pay | Admitting: Internal Medicine

## 2015-08-13 ENCOUNTER — Inpatient Hospital Stay
Admission: RE | Admit: 2015-08-13 | Discharge: 2015-08-13 | Disposition: A | Discharge status: Home / Self Care | Payer: BLUE CROSS/BLUE SHIELD | Source: Ambulatory Visit | Attending: Internal Medicine | Admitting: Internal Medicine

## 2015-08-13 DIAGNOSIS — Z1239 Encounter for other screening for malignant neoplasm of breast: Secondary | ICD-10-CM

## 2015-09-09 ENCOUNTER — Ambulatory Visit (INDEPENDENT_AMBULATORY_CARE_PROVIDER_SITE_OTHER): Payer: BLUE CROSS/BLUE SHIELD | Admitting: *Deleted

## 2015-09-09 DIAGNOSIS — Z23 Encounter for immunization: Secondary | ICD-10-CM

## 2015-11-05 ENCOUNTER — Telehealth: Payer: Self-pay

## 2015-11-05 MED ORDER — ESTRADIOL 2 MG PO TABS: ORAL_TABLET | ORAL | Status: DC

## 2015-11-05 MED ORDER — PROGESTERONE MICRONIZED 200 MG PO CAPS: ORAL_CAPSULE | ORAL | Status: DC

## 2015-11-05 NOTE — Telephone Encounter (Signed)
Pt states that she is having 20 hot flashes a day which causes her to feel nausea and tired, she stopped taking her estrogen and progesterone. I explained to Nancy Villegas that she needs to come in for a office visit to discuss medication management since she has not been seen since 07/11/15, pt is scheduled to see Dr. Darrick Huntsman on 11/21/15 at 10:30am. Please advise, thanks

## 2015-11-05 NOTE — Telephone Encounter (Signed)
She does not need to see me to resume HRT.  I will call in refills on estrogen and progesterone for 6 months  And we will  try again in 6 months

## 2015-11-05 NOTE — Telephone Encounter (Signed)
Norified pt that her appt to see Dr. Darrick Huntsman had been cancelled and that Dr. Darrick Huntsman stated that she was going to resume the pt's HRT for the next 6mos and that rx's will be called into the pharmacy.

## 2015-11-21 ENCOUNTER — Ambulatory Visit: Payer: BLUE CROSS/BLUE SHIELD | Admitting: Internal Medicine

## 2016-01-27 ENCOUNTER — Encounter: Payer: Self-pay | Admitting: Internal Medicine

## 2016-01-29 ENCOUNTER — Encounter: Payer: Self-pay | Admitting: Internal Medicine

## 2016-01-29 ENCOUNTER — Other Ambulatory Visit: Payer: Self-pay | Admitting: Internal Medicine

## 2016-01-29 ENCOUNTER — Telehealth: Payer: Self-pay | Admitting: Internal Medicine

## 2016-01-29 DIAGNOSIS — R300 Dysuria: Secondary | ICD-10-CM

## 2016-01-29 MED ORDER — FLUCONAZOLE 150 MG PO TABS: 150.0000 mg | ORAL_TABLET | Freq: Every day | ORAL | Status: DC

## 2016-01-29 NOTE — Telephone Encounter (Signed)
NOT NECESSARY ,  SAW E MAIL AND SENT IN RX

## 2016-01-29 NOTE — Telephone Encounter (Signed)
Left message on machine for patient that Rx has been sent 

## 2016-01-29 NOTE — Telephone Encounter (Signed)
Pt called in stating she has a yeast infection and wants Dr Darrick Huntsmanullo to call in Rx for her. Pt states she is going out of town. I advised pt that she may have to be seen before a Rx can be called in. Pt insisted on me sending a message to see if something can be called in. Pt also sent a email to Dr Darrick Huntsmanullo this morning.   Pharm[place] [id number] [place] - [address]  Call pt @ [phone] Thank you!

## 2016-02-23 DIAGNOSIS — H52223 Regular astigmatism, bilateral: Secondary | ICD-10-CM | POA: Diagnosis not present

## 2016-03-08 ENCOUNTER — Encounter: Payer: Self-pay | Admitting: Internal Medicine

## 2016-03-10 ENCOUNTER — Ambulatory Visit (INDEPENDENT_AMBULATORY_CARE_PROVIDER_SITE_OTHER): Payer: BLUE CROSS/BLUE SHIELD | Admitting: Internal Medicine

## 2016-03-10 ENCOUNTER — Ambulatory Visit (INDEPENDENT_AMBULATORY_CARE_PROVIDER_SITE_OTHER): Payer: BLUE CROSS/BLUE SHIELD

## 2016-03-10 ENCOUNTER — Encounter: Payer: Self-pay | Admitting: Internal Medicine

## 2016-03-10 VITALS — BP 118/72 | HR 66 | Temp 98.1°F | Resp 12 | Ht 65.5 in | Wt 136.0 lb

## 2016-03-10 DIAGNOSIS — M79622 Pain in left upper arm: Secondary | ICD-10-CM | POA: Diagnosis not present

## 2016-03-10 DIAGNOSIS — M7552 Bursitis of left shoulder: Secondary | ICD-10-CM

## 2016-03-10 DIAGNOSIS — M19012 Primary osteoarthritis, left shoulder: Secondary | ICD-10-CM | POA: Diagnosis not present

## 2016-03-10 DIAGNOSIS — M25522 Pain in left elbow: Secondary | ICD-10-CM

## 2016-03-10 MED ORDER — TRAMADOL HCL 50 MG PO TABS: 50.0000 mg | ORAL_TABLET | Freq: Three times a day (TID) | ORAL | Status: DC | PRN

## 2016-03-10 MED ORDER — MELOXICAM 15 MG PO TABS: 15.0000 mg | ORAL_TABLET | Freq: Every day | ORAL | Status: DC

## 2016-03-10 NOTE — Progress Notes (Signed)
Subjective:  Patient ID: Nancy MaskLeigh Hunt Prevost, female    DOB: 08/08/1961  Age: 55 y.o. MRN: [id number]  CC: The primary encounter diagnosis was Pain in joint, upper arm, left. A diagnosis of Bursitis of left shoulder was also pertinent to this visit.  HPI Nancy Villegas presents for evaluation of left arm pain .  The pain has been present for several weeks an dis aggravated by abduction and internal rotation.  The pain involves the biceps and triceps.  There is no history of  fall, and no history of trauma.   Outpatient Prescriptions Prior to Visit  Medication Sig Dispense Refill  . estradiol (ESTRACE) 2 MG tablet TAKE 1 TABLET (2 MG TOTAL) BY MOUTH DAILY. 90 tablet 1  . progesterone (PROMETRIUM) 200 MG capsule TAKE 1 CAPSULE BY MOUTH DAILY. IN THE EVENING FOR 12 DAYS EVERY MONTH 36 capsule 1  . ibuprofen (ADVIL,MOTRIN) 200 MG tablet Take 200 mg by mouth every 6 (six) hours as needed.    . fluconazole (DIFLUCAN) 150 MG tablet Take 1 tablet (150 mg total) by mouth daily. 2 tablet 0  . fluconazole (DIFLUCAN) 150 MG tablet Take 1 tablet (150 mg total) by mouth daily. 2 tablet 0  . metroNIDAZOLE (FLAGYL) 500 MG tablet Take 1 tablet (500 mg total) by mouth 3 (three) times daily. 21 tablet 0   No facility-administered medications prior to visit.    Review of Systems;  Patient denies headache, fevers, malaise, unintentional weight loss, skin rash, eye pain, sinus congestion and sinus pain, sore throat, dysphagia,  hemoptysis , cough, dyspnea, wheezing, chest pain, palpitations, orthopnea, edema, abdominal pain, nausea, melena, diarrhea, constipation, flank pain, dysuria, hematuria, urinary  Frequency, nocturia, numbness, tingling, seizures,  Focal weakness, Loss of consciousness,  Tremor, insomnia, depression, anxiety, and suicidal ideation.      Objective:  BP 118/72 mmHg  Pulse 66  Temp(Src) 98.1 F (36.7 C) (Oral)  Resp 12  Ht 5' 5.5" (1.664 m)  Wt 136 lb (61.689 kg)  BMI 22.28 kg/m2   SpO2 98%  LMP 03/01/2016 (Approximate)  BP Readings from Last 3 Encounters:  03/10/16 118/72  07/11/15 126/78  11/08/14 118/68    Wt Readings from Last 3 Encounters:  03/10/16 136 lb (61.689 kg)  07/11/15 139 lb 2 oz (63.107 kg)  11/08/14 144 lb 4 oz (65.431 kg)    General appearance: alert, cooperative and appears stated age Back: symmetric, no curvature. ROM normal. No CVA tenderness. Lungs: clear to auscultation bilaterally Heart: regular rate and rhythm, S1, S2 normal, no murmur, click, rub or gallop MSK left shoulder with norma contours, pain lateral shoulder brought on with abduction and forward flexion  Skin: Skin color, texture, turgor normal. No rashes or lesions Lymph nodes: Cervical, supraclavicular, and axillary nodes normal.  No results found for: HGBA1C  Lab Results  Component Value Date   CREATININE 0.57 07/11/2015   CREATININE 0.6 07/08/2014   CREATININE 0.6 05/30/2013    Lab Results  Component Value Date   WBC 6.6 07/08/2014   HGB 12.0 07/08/2014   HCT 35.6* 07/08/2014   PLT 225.0 07/08/2014   GLUCOSE 93 07/11/2015   CHOL 185 07/11/2015   TRIG 70 07/11/2015   HDL 120 07/11/2015   LDLCALC 51 07/11/2015   ALT 20 07/11/2015   AST 23 07/11/2015   NA 137 07/11/2015   K 3.9 07/11/2015   CL 101 07/11/2015   CREATININE 0.57 07/11/2015   BUN 15 07/11/2015   CO2 27 07/11/2015  TSH 0.90 07/08/2014    Mm Digital Screening Bilateral  08/18/2015  CLINICAL DATA:  Screening. EXAM: DIGITAL SCREENING BILATERAL MAMMOGRAM WITH CAD COMPARISON:  Previous exam(s). ACR Breast Density Category d: The breast tissue is extremely dense, which lowers the sensitivity of mammography. FINDINGS: There are no findings suspicious for malignancy. Images were processed with CAD. IMPRESSION: No mammographic evidence of malignancy. A result letter of this screening mammogram will be mailed directly to the patient. RECOMMENDATION: Screening mammogram in one year. (Code:SM-B-01Y)  BI-RADS CATEGORY  1: Negative. Electronically Signed   By: Rolla Plateandolph  Jackson M.D.   On: 08/18/2015 08:18    Assessment & Plan:   Problem List Items Addressed This Visit    Bursitis of left shoulder    Trial of meloxicam and activity modification.  Plain x [facility].        Other Visit Diagnoses    Pain in joint, upper arm, left    -  Primary    Relevant Orders    DG Shoulder Left (Completed)      A total of 25 minutes of face to face time was spent with patient more than half of which was spent in counselling about the above mentioned conditions  and coordination of care  I have discontinued Nancy Villegas's ibuprofen, fluconazole, metroNIDAZOLE, and fluconazole. I am also having her start on meloxicam and traMADol. Additionally, I am having her maintain her progesterone and estradiol.  Meds ordered this encounter  Medications  . meloxicam (MOBIC) 15 MG tablet    Sig: Take 1 tablet (15 mg total) by mouth daily.    Dispense:  30 tablet    Refill:  3  . traMADol (ULTRAM) 50 MG tablet    Sig: Take 1 tablet (50 mg total) by mouth every 8 (eight) hours as needed.    Dispense:  90 tablet    Refill:  0    Medications Discontinued During This Encounter  Medication Reason  . fluconazole (DIFLUCAN) 150 MG tablet Completed Course  . fluconazole (DIFLUCAN) 150 MG tablet Completed Course  . metroNIDAZOLE (FLAGYL) 500 MG tablet Completed Course  . ibuprofen (ADVIL,MOTRIN) 200 MG tablet     Follow-up: No Follow-up on file.   Sherlene ShamsULLO, TERESA L, MD

## 2016-03-10 NOTE — Progress Notes (Signed)
Pre-visit discussion using our clinic review tool. No additional management support is needed unless otherwise documented below in the visit note.  

## 2016-03-10 NOTE — Patient Instructions (Signed)
I am prescribing an NSAID, meloxicam, to take once daily instead of Aleve and Motrin  You can add 4 tylenol daily (2000 mg ) for additional pain relief  If your pain is not relieved, you can add tramadol up to 3 times daily to the above regimen  If no improvement in 2-3 weeks,  I would recommend seeing Sports medicine for a joint injection   Avoid lateral raises and placing arm behind your back.   Bursitis Bursitis is inflammation and irritation of a bursa, which is one of the small, fluid-filled sacs that cushion and protect the moving parts of your body. These sacs are located between bones and muscles, muscle attachments, or skin areas next to bones. A bursa protects these structures from the wear and tear that results from frequent movement. An inflamed bursa causes pain and swelling. Fluid may build up inside the sac. Bursitis is most common near joints, especially the knees, elbows, hips, and shoulders. CAUSES Bursitis can be caused by:   Injury from:  A direct blow, like falling on your knee or elbow.  Overuse of a joint (repetitive stress).  Infection. This can happen if bacteria gets into a bursa through a cut or scrape near a joint.  Diseases that cause joint inflammation, such as gout and rheumatoid arthritis. RISK FACTORS You may be at risk for bursitis if you:   Have a job or hobby that involves a lot of repetitive stress on your joints.  Have a condition that weakens your body's defense system (immune system), such as diabetes, cancer, or HIV.  Lift and reach overhead often.  Kneel or lean on hard surfaces often.  Run or walk often. SIGNS AND SYMPTOMS The most common signs and symptoms of bursitis are:  Pain that gets worse when you move the affected body part or put weight on it.  Inflammation.  Stiffness. Other signs and symptoms may include:  Redness.  Tenderness.  Warmth.  Pain that continues after rest.  Fever and chills. This may occur in  bursitis caused by infection. DIAGNOSIS Bursitis may be diagnosed by:   Medical history and physical exam.  MRI.  A procedure to drain fluid from the bursa with a needle (aspiration). The fluid may be checked for signs of infection or gout.  Blood tests to rule out other causes of inflammation. TREATMENT  Bursitis can usually be treated at home with rest, ice, compression, and elevation (RICE). For mild bursitis, RICE treatment may be all you need. Other treatments may include:  Nonsteroidal anti-inflammatory drugs (NSAIDs) to treat pain and inflammation.  Corticosteroids to fight inflammation. You may have these drugs injected into and around the area of bursitis.  Aspiration of bursitis fluid to relieve pain and improve movement.  Antibiotic medicine to treat an infected bursa.  A splint, brace, or walking aid.  Physical therapy if you continue to have pain or limited movement.  Surgery to remove a damaged or infected bursa. This may be needed if you have a very bad case of bursitis or if other treatments have not worked. HOME CARE INSTRUCTIONS   Take medicines only as directed by your health care provider.  If you were prescribed an antibiotic medicine, finish it all even if you start to feel better.  Rest the affected area as directed by your health care provider.  Keep the area elevated.  Avoid activities that make pain worse.  Apply ice to the injured area:  Place ice in a plastic bag.  Place  a towel between your skin and the bag.  Leave the ice on for 20 minutes, 2-3 times a day.  Use splints, braces, pads, or walking aids as directed by your health care provider.  Keep all follow-up visits as directed by your health care provider. This is important. PREVENTION   Wear knee pads if you kneel often.  Wear sturdy running or walking shoes that fit you well.  Take regular breaks from repetitive activity.  Warm up by stretching before doing any strenuous  activity.  Maintain a healthy weight or lose weight as recommended by your health care provider. Ask your health care provider if you need help.  Exercise regularly. Start any new physical activity gradually. SEEK MEDICAL CARE IF:   Your bursitis is not responding to treatment or home care.  You have a fever.  You have chills.   This information is not intended to replace advice given to you by your health care provider. Make sure you discuss any questions you have with your health care provider.   Document Released: 08/20/2000 Document Revised: 05/14/2015 Document Reviewed: 11/12/2013 Elsevier Interactive Patient Education [employer]2016 Elsevier Inc.

## 2016-03-11 ENCOUNTER — Encounter: Payer: Self-pay | Admitting: Internal Medicine

## 2016-03-12 DIAGNOSIS — M7552 Bursitis of left shoulder: Secondary | ICD-10-CM | POA: Insufficient documentation

## 2016-03-12 NOTE — Assessment & Plan Note (Addendum)
Trial of meloxicam and activity modification.  Plain x [facility].

## 2016-03-26 NOTE — Telephone Encounter (Signed)
Mailed unread message to patient.  

## 2016-04-05 ENCOUNTER — Telehealth: Payer: Self-pay | Admitting: Internal Medicine

## 2016-04-05 DIAGNOSIS — M7552 Bursitis of left shoulder: Secondary | ICD-10-CM

## 2016-04-05 NOTE — Telephone Encounter (Signed)
Pt called and would like a referral to Dr Terrilee Files, Our sports medicine doctor. Please advise?  Call pt @ [phone]. Thank you!

## 2016-04-05 NOTE — Telephone Encounter (Signed)
Noted, thanks!

## 2016-04-05 NOTE — Telephone Encounter (Signed)
Your referral is in process as requested. Our referral coordinator will call you when the appointment has been made.  

## 2016-04-05 NOTE — Telephone Encounter (Signed)
Please advise for referral, reference Mychart message on 7/6. thanks

## 2016-04-09 DIAGNOSIS — S72011D Unspecified intracapsular fracture of right femur, subsequent encounter for closed fracture with routine healing: Secondary | ICD-10-CM | POA: Diagnosis not present

## 2016-04-09 DIAGNOSIS — M25519 Pain in unspecified shoulder: Secondary | ICD-10-CM | POA: Insufficient documentation

## 2016-04-19 ENCOUNTER — Telehealth: Payer: Self-pay | Admitting: Internal Medicine

## 2016-04-19 MED ORDER — FLUCONAZOLE 150 MG PO TABS: 150.0000 mg | ORAL_TABLET | Freq: Every day | ORAL | 0 refills | Status: DC

## 2016-04-19 NOTE — Telephone Encounter (Signed)
Done, in the future please ask patient for symptoms.

## 2016-04-19 NOTE — Telephone Encounter (Signed)
Pt called wanting to know if a Rx can be called into the pharmacy for yeast infection ?   Pharmacy is [id number] - [place], [place] - [address]  Call pt @ [phone]. Thank you!

## 2016-04-19 NOTE — Telephone Encounter (Signed)
Please advise for RX for a yeast infection, thanks

## 2016-05-15 ENCOUNTER — Other Ambulatory Visit: Payer: Self-pay | Admitting: Internal Medicine

## 2016-05-25 ENCOUNTER — Telehealth: Payer: Self-pay | Admitting: *Deleted

## 2016-05-25 NOTE — Telephone Encounter (Signed)
Pt will be at [phone] this afternoon

## 2016-05-25 NOTE — Telephone Encounter (Signed)
Patient had the flu last week for 4 days. She currently has bites/or bumps in varies places on her body. She questioned id the bites/bumps are related to the flu or bed bugs, due to [place]. The bumps are mosquito bites that don't go away, they are very itchy.  Pt contact [phone]

## 2016-05-25 NOTE — Telephone Encounter (Signed)
Left a VM to return my call regarding prior call for more information. thanks

## 2016-06-01 ENCOUNTER — Telehealth: Payer: Self-pay | Admitting: Internal Medicine

## 2016-06-01 DIAGNOSIS — H5005 Alternating esotropia: Secondary | ICD-10-CM | POA: Diagnosis not present

## 2016-06-01 NOTE — Telephone Encounter (Signed)
Pt called stating that she has been having double vision for a couple of days. Scheduled patient with Dr. Birdie SonsSonnenberg for Friday 9/29 @ 1:15.

## 2016-06-01 NOTE — Telephone Encounter (Signed)
Spoken to patient. For the past couple days she has been having double vision. She had appointment with Opthalmology, they stated that her eyes ar not aligned.  Today patient said the room was moving and she vomited due to it  Patient was informed that she needed to go to ED/Urgent care to be seen.  Pt felt that it was not neccessary at this time. So Appointment was scheduled with Dr. Birdie SonsSonnenberg on Friday [id number] @ 1315. Patient did say that if SX worsen she will got to ED/Urgent care.

## 2016-06-02 ENCOUNTER — Emergency Department: Payer: BLUE CROSS/BLUE SHIELD

## 2016-06-02 ENCOUNTER — Encounter: Payer: Self-pay | Admitting: Emergency Medicine

## 2016-06-02 ENCOUNTER — Emergency Department
Admission: EM | Admit: 2016-06-02 | Discharge: 2016-06-05 | Disposition: A | Discharge status: Home / Self Care | Payer: BLUE CROSS/BLUE SHIELD | Attending: Emergency Medicine | Admitting: Emergency Medicine

## 2016-06-02 DIAGNOSIS — R51 Headache: Secondary | ICD-10-CM | POA: Insufficient documentation

## 2016-06-02 DIAGNOSIS — H8309 Labyrinthitis, unspecified ear: Secondary | ICD-10-CM | POA: Insufficient documentation

## 2016-06-02 DIAGNOSIS — Z794 Long term (current) use of insulin: Secondary | ICD-10-CM | POA: Insufficient documentation

## 2016-06-02 DIAGNOSIS — N9489 Other specified conditions associated with female genital organs and menstrual cycle: Secondary | ICD-10-CM | POA: Insufficient documentation

## 2016-06-02 DIAGNOSIS — R42 Dizziness and giddiness: Secondary | ICD-10-CM

## 2016-06-02 LAB — CBC WITH DIFFERENTIAL/PLATELET
Basophils Absolute: 0.1 10*3/uL (ref 0–0.1)
Basophils Relative: 1 %
Eosinophils Absolute: 0 10*3/uL (ref 0–0.7)
Eosinophils Relative: 0 %
HEMATOCRIT: 36.5 % (ref 35.0–47.0)
HEMOGLOBIN: 13 g/dL (ref 12.0–16.0)
LYMPHS ABS: 1.6 10*3/uL (ref 1.0–3.6)
LYMPHS PCT: 20 %
MCH: 33.3 pg (ref 26.0–34.0)
MCHC: 35.6 g/dL (ref 32.0–36.0)
MCV: 93.5 fL (ref 80.0–100.0)
MONOS PCT: 9 %
Monocytes Absolute: 0.7 10*3/uL (ref 0.2–0.9)
NEUTROS ABS: 5.6 10*3/uL (ref 1.4–6.5)
NEUTROS PCT: 70 %
Platelets: 374 10*3/uL (ref 150–440)
RBC: 3.9 MIL/uL (ref 3.80–5.20)
RDW: 12.7 % (ref 11.5–14.5)
WBC: 7.9 10*3/uL (ref 3.6–11.0)

## 2016-06-02 LAB — BASIC METABOLIC PANEL
ANION GAP: 10 (ref 5–15)
BUN: 17 mg/dL (ref 6–20)
CHLORIDE: 104 mmol/L (ref 101–111)
CO2: 24 mmol/L (ref 22–32)
Calcium: 9.5 mg/dL (ref 8.9–10.3)
Creatinine, Ser: 0.59 mg/dL (ref 0.44–1.00)
GFR calc non Af Amer: >60 mL/min (ref >60)
Glucose, Bld: 104 mg/dL — ABNORMAL HIGH (ref 65–99)
Potassium: 4 mmol/L (ref 3.5–5.1)
Sodium: 138 mmol/L (ref 135–145)

## 2016-06-02 LAB — POCT PREGNANCY, URINE: PREG TEST UR: NEGATIVE

## 2016-06-02 LAB — HCG, QUANTITATIVE, PREGNANCY: hCG, Beta Chain, Quant, S: 2 m[IU]/mL (ref <5)

## 2016-06-02 MED ORDER — MECLIZINE HCL 25 MG PO TABS
50.0000 mg | ORAL_TABLET | Freq: Once | ORAL | Status: AC
  Administered 2016-06-02: 50 mg via ORAL
  Filled 2016-06-02: qty 2

## 2016-06-02 MED ORDER — PREDNISONE 20 MG PO TABS
60.0000 mg | ORAL_TABLET | Freq: Once | ORAL | Status: AC
  Administered 2016-06-02: 60 mg via ORAL
  Filled 2016-06-02: qty 3

## 2016-06-02 MED ORDER — PREDNISONE 20 MG PO TABS: 20.0000 mg | ORAL_TABLET | Freq: Every day | ORAL | 0 refills | Status: DC

## 2016-06-02 MED ORDER — MECLIZINE HCL 25 MG PO TABS: 25.0000 mg | ORAL_TABLET | Freq: Three times a day (TID) | ORAL | 1 refills | Status: DC | PRN

## 2016-06-02 NOTE — ED Notes (Signed)
Of note, pt reports having a flu-like illness on September 10, including fever, extreme fatigue, chills, diarrhea, and headache for about 4 days.

## 2016-06-02 NOTE — ED Triage Notes (Signed)
Pt with dizziness and headache, blurred vision started last Saturday. Pt recvd flu vaccine back in the beginning of September and has not felt well since then .

## 2016-06-02 NOTE — ED Provider Notes (Signed)
[facility] Emergency Department Provider Note  ____________________________________________  Time seen: Approximately 1:38 PM  I have reviewed the triage vital signs and the nursing notes.   HISTORY  Chief Complaint Dizziness and Blurred Vision    HPI Nancy Villegas Benton is a 55 y.o. female who complains of feeling off balance and dizziness for the past 3 or 4 days. Feels like sometimes she has to take extra step to catch herself. She is not dropping things she has no other change in her coordination or balance per she has not fallen or hit her head. She is also complaining of some blurry vision over this time. No vomiting. She does have a left-sided frontotemporal headache is gradual in onset.  Denies vertigo or lightheadedness.     Past Medical History:  Diagnosis Date  . Heart murmur   . History of chicken pox   . Urine, incontinence, stress female    leaking with laughter or very full bladder     Patient Active Problem List   Diagnosis Date Noted  . Bursitis of left shoulder 03/12/2016  . Vaginitis and vulvovaginitis 07/14/2015  . Encounter for preventive health examination 07/13/2015  . Menopausal hot flushes 07/14/2013  . Well woman exam (no gynecological exam) 05/31/2013     History reviewed. No pertinent surgical history.   Prior to Admission medications   Medication Sig Start Date End Date Taking? Authorizing Provider  estradiol (ESTRACE) 2 MG tablet TAKE 1 TABLET (2 MG TOTAL) BY MOUTH DAILY. 05/17/16   Sherlene Shamseresa L Tullo, MD  fluconazole (DIFLUCAN) 150 MG tablet Take 1 tablet (150 mg total) by mouth daily. 04/19/16   Sherlene Shamseresa L Tullo, MD  meclizine (ANTIVERT) 25 MG tablet Take 1 tablet (25 mg total) by mouth 3 (three) times daily as needed for dizziness or nausea. 06/02/16   Sharman CheekPhillip Starkisha Tullis, MD  meloxicam (MOBIC) 15 MG tablet Take 1 tablet (15 mg total) by mouth daily. 03/10/16   Sherlene Shamseresa L Tullo, MD  predniSONE (DELTASONE) 20 MG tablet Take 1  tablet (20 mg total) by mouth daily. 06/02/16   Sharman CheekPhillip Floreen Teegarden, MD  progesterone (PROMETRIUM) 200 MG capsule TAKE 1 CAPSULE BY MOUTH DAILY. IN THE EVENING FOR 12 DAYS EVERY MONTH 05/17/16   Sherlene Shamseresa L Tullo, MD  traMADol (ULTRAM) 50 MG tablet Take 1 tablet (50 mg total) by mouth every 8 (eight) hours as needed. 03/10/16   Sherlene Shamseresa L Tullo, MD     Allergies Review of patient's allergies indicates no known allergies.   Family History  Problem Relation Age of Onset  . Cancer Mother [id number]    drug induced AML  . Ulcerative colitis Mother   . COPD Father   . Diabetes Father   . Cancer Father     Renal Cell CA, Lung CA    Social History Social History  Substance Use Topics  . Smoking status: Never Smoker  . Smokeless tobacco: Never Used  . Alcohol use Yes     Comment: Regular (1 beer/daily)    Review of Systems  Constitutional:   No fever or chills.  ENT:   No sore throat. No rhinorrhea. Cardiovascular:   No chest pain. Respiratory:   No dyspnea or cough. Gastrointestinal:   Negative for abdominal pain, vomiting and diarrhea.  Neurological:   Positive as above headache and dizziness 10-point ROS otherwise negative.  ____________________________________________   PHYSICAL EXAM:  VITAL SIGNS: ED Triage Vitals  Enc Vitals Group     BP 06/02/16 0951 (!) 166/81  Pulse Rate 06/02/16 0951 84     Resp 06/02/16 0951 18     Temp 06/02/16 0951 98.8 F (37.1 C)     Temp Source 06/02/16 0951 Oral     SpO2 06/02/16 0951 98 %     Weight 06/02/16 0951 132 lb (59.9 kg)     Height 06/02/16 0951 5\' 6"  (1.676 m)     Head Circumference --      Peak Flow --      Pain Score 06/02/16 0952 4     Pain Loc --      Pain Edu? --      Excl. in GC? --     Vital signs reviewed, nursing assessments reviewed.   Constitutional:   Alert and oriented. Well appearing and in no distress. Eyes:   No scleral icterus. No conjunctival pallor. PERRL. EOMI.  No nystagmus. ENT   Head:   Normocephalic  and atraumatic.TMs normal bilaterally. Normal external canals   Nose:   No congestion/rhinnorhea. No septal hematoma   Mouth/Throat:   MMM, no pharyngeal erythema. No peritonsillar mass.    Neck:   No stridor. No SubQ emphysema. No meningismus. Hematological/Lymphatic/Immunilogical:   No cervical lymphadenopathy. Cardiovascular:   RRR. Symmetric bilateral radial and DP pulses.  No murmurs.  Respiratory:   Normal respiratory effort without tachypnea nor retractions. Breath sounds are clear and equal bilaterally. No wheezes/rales/rhonchi. Gastrointestinal:   Soft and nontender. Non distended. There is no CVA tenderness.  No rebound, rigidity, or guarding. Genitourinary:   deferred Musculoskeletal:   Nontender with normal range of motion in all extremities. No joint effusions.  No lower extremity tenderness.  No edema. Neurologic:   Normal speech and language.  CN 2-10 normal. No pronator drift. Normal finger to nose. Normal heel shin. Normal gait. Motor grossly intact. No gross focal neurologic deficits are appreciated.  Skin:    Skin is warm, dry and intact. No rash noted.  No petechiae, purpura, or bullae.  ____________________________________________    LABS (pertinent positives/negatives) (all labs ordered are listed, but only abnormal results are displayed) Labs Reviewed  BASIC METABOLIC PANEL - Abnormal; Notable for the following:       Result Value   Glucose, Bld 104 (*)    All other components within normal limits  CBC WITH DIFFERENTIAL/PLATELET  HCG, QUANTITATIVE, PREGNANCY  POC URINE PREG, ED  POCT PREGNANCY, URINE   ____________________________________________   EKG    ____________________________________________    RADIOLOGY  CT head unremarkable  ____________________________________________   PROCEDURES Procedures  ____________________________________________   INITIAL IMPRESSION / ASSESSMENT AND PLAN / ED COURSE  Pertinent labs & imaging  results that were available during my care of the patient were reviewed by me and considered in my medical decision making (see chart for details).  Patient presents with vague dizziness after recent viral syndrome. Neurologic exam is completely unremarkable. She is well-appearing and in no distress. She was given meclizine and prednisone for suspected labyrinthitis while obtaining a workup. Workup was negative. Patient feels much better and symptoms have resolved. We'll continue meclizine and prednisone plus she follows up with neurology in 2 days.     Clinical Course   ____________________________________________   FINAL CLINICAL IMPRESSION(S) / ED DIAGNOSES  Final diagnoses:  Dizziness  Labyrinthitis, unspecified laterality       Portions of this note were generated with dragon dictation software. Dictation errors may occur despite best attempts at proofreading.    Sharman Cheek, MD 06/02/16 1344

## 2016-06-04 ENCOUNTER — Ambulatory Visit: Payer: BLUE CROSS/BLUE SHIELD | Admitting: Family Medicine

## 2016-06-04 DIAGNOSIS — R270 Ataxia, unspecified: Secondary | ICD-10-CM | POA: Diagnosis not present

## 2016-06-04 DIAGNOSIS — Z0289 Encounter for other administrative examinations: Secondary | ICD-10-CM

## 2016-06-04 DIAGNOSIS — H532 Diplopia: Secondary | ICD-10-CM | POA: Diagnosis not present

## 2016-06-05 DIAGNOSIS — R270 Ataxia, unspecified: Secondary | ICD-10-CM | POA: Diagnosis not present

## 2016-06-08 ENCOUNTER — Other Ambulatory Visit: Payer: Self-pay | Admitting: Neurology

## 2016-06-08 DIAGNOSIS — R270 Ataxia, unspecified: Secondary | ICD-10-CM | POA: Diagnosis not present

## 2016-06-08 DIAGNOSIS — H532 Diplopia: Secondary | ICD-10-CM | POA: Diagnosis not present

## 2016-06-18 ENCOUNTER — Ambulatory Visit
Admission: RE | Admit: 2016-06-18 | Discharge: 2016-06-18 | Disposition: A | Discharge status: Home / Self Care | Payer: BLUE CROSS/BLUE SHIELD | Source: Ambulatory Visit | Attending: Neurology | Admitting: Neurology

## 2016-06-18 DIAGNOSIS — R270 Ataxia, unspecified: Secondary | ICD-10-CM

## 2016-06-18 MED ORDER — GADOBENATE DIMEGLUMINE 529 MG/ML IV SOLN
15.0000 mL | Freq: Once | INTRAVENOUS | Status: AC | PRN
  Administered 2016-06-18: 12 mL via INTRAVENOUS

## 2016-06-22 DIAGNOSIS — G933 Postviral fatigue syndrome: Secondary | ICD-10-CM | POA: Diagnosis not present

## 2016-07-18 ENCOUNTER — Other Ambulatory Visit: Payer: Self-pay | Admitting: Internal Medicine

## 2016-07-19 ENCOUNTER — Other Ambulatory Visit: Payer: Self-pay | Admitting: Internal Medicine

## 2016-07-19 DIAGNOSIS — Z1231 Encounter for screening mammogram for malignant neoplasm of breast: Secondary | ICD-10-CM

## 2016-09-01 ENCOUNTER — Ambulatory Visit
Admission: RE | Admit: 2016-09-01 | Discharge: 2016-09-01 | Disposition: A | Discharge status: Home / Self Care | Payer: BLUE CROSS/BLUE SHIELD | Source: Ambulatory Visit | Attending: Internal Medicine | Admitting: Internal Medicine

## 2016-09-01 DIAGNOSIS — Z1231 Encounter for screening mammogram for malignant neoplasm of breast: Secondary | ICD-10-CM | POA: Diagnosis not present

## 2016-09-02 ENCOUNTER — Encounter: Payer: Self-pay | Admitting: Internal Medicine

## 2016-09-02 ENCOUNTER — Ambulatory Visit (INDEPENDENT_AMBULATORY_CARE_PROVIDER_SITE_OTHER): Payer: BLUE CROSS/BLUE SHIELD | Admitting: Internal Medicine

## 2016-09-02 ENCOUNTER — Telehealth: Payer: Self-pay

## 2016-09-02 DIAGNOSIS — M7552 Bursitis of left shoulder: Secondary | ICD-10-CM

## 2016-09-02 DIAGNOSIS — Z0000 Encounter for general adult medical examination without abnormal findings: Secondary | ICD-10-CM | POA: Diagnosis not present

## 2016-09-02 MED ORDER — FLUCONAZOLE 150 MG PO TABS: 150.0000 mg | ORAL_TABLET | Freq: Every day | ORAL | 2 refills | Status: DC

## 2016-09-02 NOTE — Telephone Encounter (Signed)
Mrs Nancy Villegas wanted to know if you were going to call in something for her yeast infection... This was mention to me on her way out during discharge

## 2016-09-02 NOTE — Telephone Encounter (Signed)
Lmom for patient to call back 

## 2016-09-02 NOTE — Patient Instructions (Signed)
I recommend getting half of your calcium and Vitamin D  Requirements through dietary sources rather than supplements given the recent association of calcium supplements with increased coronary artery calcium scores (You need 1200 mg daily  OF CALCIUM AND 1000 IUs of D3) )   Unsweetened almond/coconut milk, Cashew and soy  Milks are all great low calorie low carb, cholesterol free sources of dietary calcium and vitamin D.     You should try [employer]'s Sinus rinse ;  It is a stong sinus "flush" using water and medicated salts.  Do it over the sink because it can be a bit messy.  Once daily use has been helpful in prevention of upper respiratory tract infections   Health Maintenance for Postmenopausal Women Introduction Menopause is a normal process in which your reproductive ability comes to an end. This process happens gradually over a span of months to years, usually between the ages of 9 and 37. Menopause is complete when you have missed 12 consecutive menstrual periods. It is important to talk with your health care provider about some of the most common conditions that affect postmenopausal women, such as heart disease, cancer, and bone loss (osteoporosis). Adopting a healthy lifestyle and getting preventive care can help to promote your health and wellness. Those actions can also lower your chances of developing some of these common conditions. What should I know about menopause? During menopause, you may experience a number of symptoms, such as:  Moderate-to-severe hot flashes.  Night sweats.  Decrease in sex drive.  Mood swings.  Headaches.  Tiredness.  Irritability.  Memory problems.  Insomnia. Choosing to treat or not to treat menopausal changes is an individual decision that you make with your health care provider. What should I know about hormone replacement therapy and supplements? Hormone therapy products are effective for treating symptoms that are associated with  menopause, such as hot flashes and night sweats. Hormone replacement carries certain risks, especially as you become older. If you are thinking about using estrogen or estrogen with progestin treatments, discuss the benefits and risks with your health care provider. What should I know about heart disease and stroke? Heart disease, heart attack, and stroke become more likely as you age. This may be due, in part, to the hormonal changes that your body experiences during menopause. These can affect how your body processes dietary fats, triglycerides, and cholesterol. Heart attack and stroke are both medical emergencies. There are many things that you can do to help prevent heart disease and stroke:  Have your blood pressure checked at least every 1-2 years. High blood pressure causes heart disease and increases the risk of stroke.  If you are 19-46 years old, ask your health care provider if you should take aspirin to prevent a heart attack or a stroke.  Do not use any tobacco products, including cigarettes, chewing tobacco, or electronic cigarettes. If you need help quitting, ask your health care provider.  It is important to eat a healthy diet and maintain a healthy weight.  Be sure to include plenty of vegetables, fruits, low-fat dairy products, and lean protein.  Avoid eating foods that are high in solid fats, added sugars, or salt (sodium).  Get regular exercise. This is one of the most important things that you can do for your health.  Try to exercise for at least 150 minutes each week. The type of exercise that you do should increase your heart rate and make you sweat. This is known as moderate-intensity exercise.  Try to do strengthening exercises at least twice each week. Do these in addition to the moderate-intensity exercise.  Know your numbers.Ask your health care provider to check your cholesterol and your blood glucose. Continue to have your blood tested as directed by your health  care provider. What should I know about cancer screening? There are several types of cancer. Take the following steps to reduce your risk and to catch any cancer development as early as possible. Breast Cancer  Practice breast self-awareness.  This means understanding how your breasts normally appear and feel.  It also means doing regular breast self-exams. Let your health care provider know about any changes, no matter how small.  If you are 90 or older, have a clinician do a breast exam (clinical breast exam or CBE) every year. Depending on your age, family history, and medical history, it may be recommended that you also have a yearly breast X-ray (mammogram).  If you have a family history of breast cancer, talk with your health care provider about genetic screening.  If you are at high risk for breast cancer, talk with your health care provider about having an MRI and a mammogram every year.  Breast cancer (BRCA) gene test is recommended for women who have family members with BRCA-related cancers. Results of the assessment will determine the need for genetic counseling and BRCA1 and for BRCA2 testing. BRCA-related cancers include these types:  Breast. This occurs in males or females.  Ovarian.  Tubal. This may also be called fallopian tube cancer.  Cancer of the abdominal or pelvic lining (peritoneal cancer).  Prostate.  Pancreatic. Cervical, Uterine, and Ovarian Cancer  Your health care provider may recommend that you be screened regularly for cancer of the pelvic organs. These include your ovaries, uterus, and vagina. This screening involves a pelvic exam, which includes checking for microscopic changes to the surface of your cervix (Pap test).  For women ages 21-65, health care providers may recommend a pelvic exam and a Pap test every three years. For women ages 40-65, they may recommend the Pap test and pelvic exam, combined with testing for human papilloma virus (HPV),  every five years. Some types of HPV increase your risk of cervical cancer. Testing for HPV may also be done on women of any age who have unclear Pap test results.  Other health care providers may not recommend any screening for nonpregnant women who are considered low risk for pelvic cancer and have no symptoms. Ask your health care provider if a screening pelvic exam is right for you.  If you have had past treatment for cervical cancer or a condition that could lead to cancer, you need Pap tests and screening for cancer for at least 20 years after your treatment. If Pap tests have been discontinued for you, your risk factors (such as having a new sexual partner) need to be reassessed to determine if you should start having screenings again. Some women have medical problems that increase the chance of getting cervical cancer. In these cases, your health care provider may recommend that you have screening and Pap tests more often.  If you have a family history of uterine cancer or ovarian cancer, talk with your health care provider about genetic screening.  If you have vaginal bleeding after reaching menopause, tell your health care provider.  There are currently no reliable tests available to screen for ovarian cancer. Lung Cancer  Lung cancer screening is recommended for adults 47-37 years old who are at  high risk for lung cancer because of a history of smoking. A yearly low-dose CT scan of the lungs is recommended if you:  Currently smoke.  Have a history of at least 30 pack-years of smoking and you currently smoke or have quit within the past 15 years. A pack-year is smoking an average of one pack of cigarettes per day for one year. Yearly screening should:  Continue until it has been 15 years since you quit.  Stop if you develop a health problem that would prevent you from having lung cancer treatment. Colorectal Cancer  This type of cancer can be detected and can often be  prevented.  Routine colorectal cancer screening usually begins at age 82 and continues through age 21.  If you have risk factors for colon cancer, your health care provider may recommend that you be screened at an earlier age.  If you have a family history of colorectal cancer, talk with your health care provider about genetic screening.  Your health care provider may also recommend using home test kits to check for hidden blood in your stool.  A small camera at the end of a tube can be used to examine your colon directly (sigmoidoscopy or colonoscopy). This is done to check for the earliest forms of colorectal cancer.  Direct examination of the colon should be repeated every 5-10 years until age 51. However, if early forms of precancerous polyps or small growths are found or if you have a family history or genetic risk for colorectal cancer, you may need to be screened more often. Skin Cancer  Check your skin from head to toe regularly.  Monitor any moles. Be sure to tell your health care provider:  About any new moles or changes in moles, especially if there is a change in a mole's shape or color.  If you have a mole that is larger than the size of a pencil eraser.  If any of your family members has a history of skin cancer, especially at a young age, talk with your health care provider about genetic screening.  Always use sunscreen. Apply sunscreen liberally and repeatedly throughout the day.  Whenever you are outside, protect yourself by wearing long sleeves, pants, a wide-brimmed hat, and sunglasses. What should I know about osteoporosis? Osteoporosis is a condition in which bone destruction happens more quickly than new bone creation. After menopause, you may be at an increased risk for osteoporosis. To help prevent osteoporosis or the bone fractures that can happen because of osteoporosis, the following is recommended:  If you are 36-31 years old, get at least 1,000 mg of  calcium and at least 600 mg of vitamin D per day.  If you are older than age 65 but younger than age 86, get at least 1,200 mg of calcium and at least 600 mg of vitamin D per day.  If you are older than age 68, get at least 1,200 mg of calcium and at least 800 mg of vitamin D per day. Smoking and excessive alcohol intake increase the risk of osteoporosis. Eat foods that are rich in calcium and vitamin D, and do weight-bearing exercises several times each week as directed by your health care provider. What should I know about how menopause affects my mental health? Depression may occur at any age, but it is more common as you become older. Common symptoms of depression include:  Low or sad mood.  Changes in sleep patterns.  Changes in appetite or eating patterns.  Feeling an overall lack of motivation or enjoyment of activities that you previously enjoyed.  Frequent crying spells. Talk with your health care provider if you think that you are experiencing depression. What should I know about immunizations? It is important that you get and maintain your immunizations. These include:  Tetanus, diphtheria, and pertussis (Tdap) booster vaccine.  Influenza every year before the flu season begins.  Pneumonia vaccine.  Shingles vaccine. Your health care provider may also recommend other immunizations. This information is not intended to replace advice given to you by your health care provider. Make sure you discuss any questions you have with your health care provider. Document Released: 10/15/2005 Document Revised: 03/12/2016 Document Reviewed: 05/27/2015  2017 [employer]

## 2016-09-02 NOTE — Progress Notes (Signed)
Pre-visit discussion using our clinic review tool. No additional management support is needed unless otherwise documented below in the visit note.  

## 2016-09-02 NOTE — Telephone Encounter (Signed)
Yes, fluconazole 150 mg daily for 2 days ,  With refills,  Sent to pharmacy

## 2016-09-02 NOTE — Progress Notes (Signed)
Patient ID: Nancy Villegas, female    DOB: 03/03/1961  Age: 55 y.o. MRN: [id number]  The patient is here for annual Medicare wellness examination and management of other chronic and acute problems.   MAMMOGRAM DONE BY [facility] YESTERDAY FLU like ILLNESS 3 months ago  HAD PAP SMEAR 2016 , NORMAL  COLONOSCOPY NORMAL 2014  10 YRS    PERIODs have been regularly occurring .  ,  TAKING PROMETRIUM AND ESTRADIOL  TREATED FOR FROZEN SHOULDER LEFT SHOULDER BY [facility]    The risk factors are reflected in the social history.  The roster of all physicians providing medical care to patient - is listed in the Snapshot section of the chart.  Activities of daily living:  The patient is 100% independent in all ADLs: dressing, toileting, feeding as well as independent mobility  Home safety : The patient has smoke detectors in the home. They wear seatbelts.  There are no firearms at home. There is no violence in the home.   There is no risks for hepatitis, STDs or HIV. There is no   history of blood transfusion. They have no travel history to infectious disease endemic areas of the world.  The patient has seen their dentist in the last six month. They have seen their eye doctor in the last year. They admit to slight hearing difficulty with regard to whispered voices and some television programs.  They have deferred audiologic testing in the last year.  They do not  have excessive sun exposure. Discussed the need for sun protection: hats, long sleeves and use of sunscreen if there is significant sun exposure.   Diet: the importance of a healthy diet is discussed. They do have a healthy diet.  The benefits of regular aerobic exercise were discussed. She walks 4 times per week ,  20 minutes.   Depression screen: there are no signs or vegative symptoms of depression- irritability, change in appetite, anhedonia, sadness/tearfullness.  Cognitive assessment: the patient manages all their financial and  personal affairs and is actively engaged. They could relate day,date,year and events; recalled 2/3 objects at 3 minutes; performed clock-face test normally.  The following portions of the patient's history were reviewed and updated as appropriate: allergies, current medications, past family history, past medical history,  past surgical history, past social history  and problem list.  Visual acuity was not assessed per patient preference since she has regular follow up with her ophthalmologist. Hearing and body mass index were assessed and reviewed.   During the course of the visit the patient was educated and counseled about appropriate screening and preventive services including : fall prevention , diabetes screening, nutrition counseling, colorectal cancer screening, and recommended immunizations.    CC: Diagnoses of Encounter for preventive health examination and Bursitis of left shoulder were pertinent to this visit.  Had a  prolonged viral illness September,  LASTED 2 WEEKS,  INCLUDED DIARRHEA , LOST 4 LBS. STARTED WITH SEVERE BODY ACHES AND HEADACHES. Then developed  Double vision  Without vertigo .  Started with blurred vision for 2-3 days followed by diplopia,  Went to ED ER DID A CT AND MRI ALSO DONE .  Saw eye doctor  and neurologist.  Diagnosed with a temporary LATERAL RECTUS PALSY .   TREATED WITH PRISM .  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   She has  no past surgical history on file.   Her family history includes COPD in her father; Cancer in her father; Cancer (age of onset: 25) in her mother; Diabetes in her father; Ulcerative colitis in her mother.She reports that she has never smoked. She has never used smokeless tobacco. She reports that she drinks alcohol. She reports that she does not use drugs.  Outpatient Medications Prior to Visit   Medication Sig Dispense Refill  . estradiol (ESTRACE) 2 MG tablet TAKE 1 TABLET (2 MG TOTAL) BY MOUTH DAILY. 90 tablet 1  . progesterone (PROMETRIUM) 200 MG capsule TAKE 1 CAPSULE BY MOUTH DAILY. IN THE EVENING FOR 12 DAYS EVERY MONTH 36 capsule 2  . fluconazole (DIFLUCAN) 150 MG tablet Take 1 tablet (150 mg total) by mouth daily. 2 tablet 0  . meclizine (ANTIVERT) 25 MG tablet Take 1 tablet (25 mg total) by mouth 3 (three) times daily as needed for dizziness or nausea. 30 tablet 1  . meloxicam (MOBIC) 15 MG tablet Take 1 tablet (15 mg total) by mouth daily. 30 tablet 3  . predniSONE (DELTASONE) 20 MG tablet Take 1 tablet (20 mg total) by mouth daily. 4 tablet 0  . traMADol (ULTRAM) 50 MG tablet Take 1 tablet (50 mg total) by mouth every 8 (eight) hours as needed. 90 tablet 0   No facility-administered medications prior to visit.     Review of Systems   Patient denies headache, fevers, malaise, unintentional weight loss, skin rash, eye pain, sinus congestion and sinus pain, sore throat, dysphagia,  hemoptysis , cough, dyspnea, wheezing, chest pain, palpitations, orthopnea, edema, abdominal pain, nausea, melena, diarrhea, constipation, flank pain, dysuria, hematuria, urinary  Frequency, nocturia, numbness, tingling, seizures,  Focal weakness, Loss of consciousness,  Tremor, insomnia, depression, anxiety, and suicidal ideation.     Objective:  BP 108/66   Pulse 60   Temp 98.1 F (36.7 C) (Oral)   Resp 16   Ht 5' 5.5" (1.664 m)   Wt 136 lb 4 oz (61.8 kg)   LMP 08/19/2016 (Within Weeks)   SpO2 98%   BMI 22.33 kg/m   Physical Exam   General appearance: alert, cooperative and appears stated age Head: Normocephalic, without obvious abnormality, atraumatic Eyes: conjunctivae/corneas clear. PERRL, EOM's intact. Fundi benign. Ears: normal TM's and external ear canals both ears Nose: Nares normal. Septum midline. Mucosa normal. No drainage or sinus tenderness. Throat: lips, mucosa, and  tongue normal; teeth and gums normal Neck: no adenopathy, no carotid bruit, no JVD, supple, symmetrical, trachea midline and thyroid not enlarged, symmetric, no tenderness/mass/nodules Lungs: clear to auscultation bilaterally Breasts: normal appearance, no masses or tenderness Heart: regular rate and rhythm, S1, S2 normal, no murmur, click, rub or gallop Abdomen: soft, non-tender; bowel sounds normal; no masses,  no organomegaly Extremities: extremities normal, atraumatic, no cyanosis or edema Pulses: 2+ and symmetric Skin: Skin color, texture, turgor normal. No rashes or lesions Neurologic: Alert and oriented X 3, normal strength and tone. Normal symmetric reflexes. Normal coordination and gait.      Assessment & Plan:   Problem List Items Addressed This Visit    Bursitis of left shoulder    She developed a frozen shoulder, per [facility], which has resolved.       Encounter for preventive health examination    Annual comprehensive preventive exam was done as well as an evaluation and management of chronic conditions .  During the course of the visit the patient was educated and counseled about appropriate screening  and preventive services including :  diabetes screening, lipid analysis with projected  10 year  risk for CAD , nutrition counseling, breast, cervical and colorectal cancer screening, and recommended immunizations.  Printed recommendations for health maintenance screenings was given.         I have discontinued Ms. Lavone Neriarker's meloxicam, traMADol, fluconazole, meclizine, and predniSONE. I am also having her start on fluconazole. Additionally, I am having her maintain her estradiol and progesterone.  Meds ordered this encounter  Medications  . fluconazole (DIFLUCAN) 150 MG tablet    Sig: Take 1 tablet (150 mg total) by mouth daily.    Dispense:  2 tablet    Refill:  2    Medications Discontinued During This Encounter  Medication Reason  . fluconazole  (DIFLUCAN) 150 MG tablet Completed Course  . meclizine (ANTIVERT) 25 MG tablet Completed Course  . meloxicam (MOBIC) 15 MG tablet No longer needed (for PRN medications)  . predniSONE (DELTASONE) 20 MG tablet Completed Course  . traMADol (ULTRAM) 50 MG tablet No longer needed (for PRN medications)    Follow-up: No Follow-up on file.   Sherlene ShamsULLO, Jagger Demonte L, MD

## 2016-09-04 NOTE — Assessment & Plan Note (Signed)
Annual comprehensive preventive exam was done as well as an evaluation and management of chronic conditions .  During the course of the visit the patient was educated and counseled about appropriate screening and preventive services including :  diabetes screening, lipid analysis with projected  10 year  risk for CAD , nutrition counseling, breast, cervical and colorectal cancer screening, and recommended immunizations.  Printed recommendations for health maintenance screenings was given 

## 2016-09-04 NOTE — Assessment & Plan Note (Signed)
[facility] which [facility].

## 2016-11-29 ENCOUNTER — Other Ambulatory Visit: Payer: Self-pay | Admitting: Internal Medicine

## 2017-05-06 ENCOUNTER — Other Ambulatory Visit: Payer: Self-pay | Admitting: Internal Medicine

## 2017-05-23 ENCOUNTER — Other Ambulatory Visit: Payer: Self-pay | Admitting: Internal Medicine

## 2017-08-02 ENCOUNTER — Other Ambulatory Visit: Payer: Self-pay | Admitting: Internal Medicine

## 2017-08-02 DIAGNOSIS — Z1231 Encounter for screening mammogram for malignant neoplasm of breast: Secondary | ICD-10-CM

## 2017-08-17 DIAGNOSIS — H5213 Myopia, bilateral: Secondary | ICD-10-CM | POA: Diagnosis not present

## 2017-08-23 ENCOUNTER — Encounter: Payer: Self-pay | Admitting: Internal Medicine

## 2017-08-23 ENCOUNTER — Ambulatory Visit (INDEPENDENT_AMBULATORY_CARE_PROVIDER_SITE_OTHER): Payer: BLUE CROSS/BLUE SHIELD | Admitting: Internal Medicine

## 2017-08-23 VITALS — BP 122/74 | HR 60 | Temp 98.3°F | Resp 15 | Ht 65.5 in | Wt 136.6 lb

## 2017-08-23 DIAGNOSIS — R29898 Other symptoms and signs involving the musculoskeletal system: Secondary | ICD-10-CM

## 2017-08-23 DIAGNOSIS — Z23 Encounter for immunization: Secondary | ICD-10-CM

## 2017-08-23 DIAGNOSIS — N951 Menopausal and female climacteric states: Secondary | ICD-10-CM

## 2017-08-23 DIAGNOSIS — Z0000 Encounter for general adult medical examination without abnormal findings: Secondary | ICD-10-CM | POA: Diagnosis not present

## 2017-08-23 LAB — CBC WITH DIFFERENTIAL/PLATELET
BASOS PCT: 0.4 % (ref 0.0–3.0)
Basophils Absolute: 0 10*3/uL (ref 0.0–0.1)
EOS PCT: 1.8 % (ref 0.0–5.0)
Eosinophils Absolute: 0.1 10*3/uL (ref 0.0–0.7)
HEMATOCRIT: 37.6 % (ref 36.0–46.0)
HEMOGLOBIN: 12.7 g/dL (ref 12.0–15.0)
LYMPHS PCT: 33.2 % (ref 12.0–46.0)
Lymphs Abs: 2.1 10*3/uL (ref 0.7–4.0)
MCHC: 33.8 g/dL (ref 30.0–36.0)
MCV: 97 fl (ref 78.0–100.0)
MONOS PCT: 7.2 % (ref 3.0–12.0)
Monocytes Absolute: 0.5 10*3/uL (ref 0.1–1.0)
Neutro Abs: 3.7 10*3/uL (ref 1.4–7.7)
Neutrophils Relative %: 57.4 % (ref 43.0–77.0)
Platelets: 241 10*3/uL (ref 150.0–400.0)
RBC: 3.87 Mil/uL (ref 3.87–5.11)
RDW: 12.5 % (ref 11.5–15.5)
WBC: 6.4 10*3/uL (ref 4.0–10.5)

## 2017-08-23 LAB — LIPID PANEL
CHOLESTEROL: 191 mg/dL (ref 0–200)
HDL: 107 mg/dL (ref >39.00)
LDL Cholesterol: 69 mg/dL (ref 0–99)
NonHDL: 83.7
TRIGLYCERIDES: 72 mg/dL (ref 0.0–149.0)
Total CHOL/HDL Ratio: 2
VLDL: 14.4 mg/dL (ref 0.0–40.0)

## 2017-08-23 LAB — COMPREHENSIVE METABOLIC PANEL
ALBUMIN: 4.1 g/dL (ref 3.5–5.2)
ALT: 12 U/L (ref 0–35)
AST: 17 U/L (ref 0–37)
Alkaline Phosphatase: 35 U/L — ABNORMAL LOW (ref 39–117)
BUN: 23 mg/dL (ref 6–23)
CALCIUM: 9.3 mg/dL (ref 8.4–10.5)
CHLORIDE: 101 meq/L (ref 96–112)
CO2: 30 mEq/L (ref 19–32)
CREATININE: 0.64 mg/dL (ref 0.40–1.20)
GFR: 102.17 mL/min (ref >60.00)
Glucose, Bld: 115 mg/dL — ABNORMAL HIGH (ref 70–99)
POTASSIUM: 4 meq/L (ref 3.5–5.1)
Sodium: 137 mEq/L (ref 135–145)
Total Bilirubin: 0.3 mg/dL (ref 0.2–1.2)
Total Protein: 7.1 g/dL (ref 6.0–8.3)

## 2017-08-23 LAB — TSH: TSH: 0.95 u[IU]/mL (ref 0.35–4.50)

## 2017-08-23 MED ORDER — ZOSTER VAC RECOMB ADJUVANTED 50 MCG/0.5ML IM SUSR: 0.5000 mL | Freq: Once | INTRAMUSCULAR | 1 refills | Status: AC

## 2017-08-23 NOTE — Patient Instructions (Signed)
Your left arm is normal.  I do not see any physical reason why you don't swing your arm.  I think it may be related to yoru prior frozen shoulder.  Try keeping your right arm still when you walk,  Start working out with low weights to build your confidence and muscle mass.      Health Maintenance for Postmenopausal Women Menopause is a normal process in which your reproductive ability comes to an end. This process happens gradually over a span of months to years, usually between the ages of 55 and 33. Menopause is complete when you have missed 12 consecutive menstrual periods. It is important to talk with your health care provider about some of the most common conditions that affect postmenopausal women, such as heart disease, cancer, and bone loss (osteoporosis). Adopting a healthy lifestyle and getting preventive care can help to promote your health and wellness. Those actions can also lower your chances of developing some of these common conditions. What should I know about menopause? During menopause, you may experience a number of symptoms, such as:  Moderate-to-severe hot flashes.  Night sweats.  Decrease in sex drive.  Mood swings.  Headaches.  Tiredness.  Irritability.  Memory problems.  Insomnia.  Choosing to treat or not to treat menopausal changes is an individual decision that you make with your health care provider. What should I know about hormone replacement therapy and supplements? Hormone therapy products are effective for treating symptoms that are associated with menopause, such as hot flashes and night sweats. Hormone replacement carries certain risks, especially as you become older. If you are thinking about using estrogen or estrogen with progestin treatments, discuss the benefits and risks with your health care provider. What should I know about heart disease and stroke? Heart disease, heart attack, and stroke become more likely as you age. This may be due,  in part, to the hormonal changes that your body experiences during menopause. These can affect how your body processes dietary fats, triglycerides, and cholesterol. Heart attack and stroke are both medical emergencies. There are many things that you can do to help prevent heart disease and stroke:  Have your blood pressure checked at least every 1-2 years. High blood pressure causes heart disease and increases the risk of stroke.  If you are 55-29 years old, ask your health care provider if you should take aspirin to prevent a heart attack or a stroke.  Do not use any tobacco products, including cigarettes, chewing tobacco, or electronic cigarettes. If you need help quitting, ask your health care provider.  It is important to eat a healthy diet and maintain a healthy weight. ? Be sure to include plenty of vegetables, fruits, low-fat dairy products, and lean protein. ? Avoid eating foods that are high in solid fats, added sugars, or salt (sodium).  Get regular exercise. This is one of the most important things that you can do for your health. ? Try to exercise for at least 150 minutes each week. The type of exercise that you do should increase your heart rate and make you sweat. This is known as moderate-intensity exercise. ? Try to do strengthening exercises at least twice each week. Do these in addition to the moderate-intensity exercise.  Know your numbers.Ask your health care provider to check your cholesterol and your blood glucose. Continue to have your blood tested as directed by your health care provider.  What should I know about cancer screening? There are several types of cancer. Take  the following steps to reduce your risk and to catch any cancer development as early as possible. Breast Cancer  Practice breast self-awareness. ? This means understanding how your breasts normally appear and feel. ? It also means doing regular breast self-exams. Let your health care provider know  about any changes, no matter how small.  If you are 48 or older, have a clinician do a breast exam (clinical breast exam or CBE) every year. Depending on your age, family history, and medical history, it may be recommended that you also have a yearly breast X-ray (mammogram).  If you have a family history of breast cancer, talk with your health care provider about genetic screening.  If you are at high risk for breast cancer, talk with your health care provider about having an MRI and a mammogram every year.  Breast cancer (BRCA) gene test is recommended for women who have family members with BRCA-related cancers. Results of the assessment will determine the need for genetic counseling and BRCA1 and for BRCA2 testing. BRCA-related cancers include these types: ? Breast. This occurs in males or females. ? Ovarian. ? Tubal. This may also be called fallopian tube cancer. ? Cancer of the abdominal or pelvic lining (peritoneal cancer). ? Prostate. ? Pancreatic.  Cervical, Uterine, and Ovarian Cancer Your health care provider may recommend that you be screened regularly for cancer of the pelvic organs. These include your ovaries, uterus, and vagina. This screening involves a pelvic exam, which includes checking for microscopic changes to the surface of your cervix (Pap test).  For women ages 21-65, health care providers may recommend a pelvic exam and a Pap test every three years. For women ages 27-65, they may recommend the Pap test and pelvic exam, combined with testing for human papilloma virus (HPV), every five years. Some types of HPV increase your risk of cervical cancer. Testing for HPV may also be done on women of any age who have unclear Pap test results.  Other health care providers may not recommend any screening for nonpregnant women who are considered low risk for pelvic cancer and have no symptoms. Ask your health care provider if a screening pelvic exam is right for you.  If you have  had past treatment for cervical cancer or a condition that could lead to cancer, you need Pap tests and screening for cancer for at least 20 years after your treatment. If Pap tests have been discontinued for you, your risk factors (such as having a new sexual partner) need to be reassessed to determine if you should start having screenings again. Some women have medical problems that increase the chance of getting cervical cancer. In these cases, your health care provider may recommend that you have screening and Pap tests more often.  If you have a family history of uterine cancer or ovarian cancer, talk with your health care provider about genetic screening.  If you have vaginal bleeding after reaching menopause, tell your health care provider.  There are currently no reliable tests available to screen for ovarian cancer.  Lung Cancer Lung cancer screening is recommended for adults 80-37 years old who are at high risk for lung cancer because of a history of smoking. A yearly low-dose CT scan of the lungs is recommended if you:  Currently smoke.  Have a history of at least 30 pack-years of smoking and you currently smoke or have quit within the past 15 years. A pack-year is smoking an average of one pack of cigarettes per  day for one year.  Yearly screening should:  Continue until it has been 15 years since you quit.  Stop if you develop a health problem that would prevent you from having lung cancer treatment.  Colorectal Cancer  This type of cancer can be detected and can often be prevented.  Routine colorectal cancer screening usually begins at age 29 and continues through age 64.  If you have risk factors for colon cancer, your health care provider may recommend that you be screened at an earlier age.  If you have a family history of colorectal cancer, talk with your health care provider about genetic screening.  Your health care provider may also recommend using home test kits  to check for hidden blood in your stool.  A small camera at the end of a tube can be used to examine your colon directly (sigmoidoscopy or colonoscopy). This is done to check for the earliest forms of colorectal cancer.  Direct examination of the colon should be repeated every 5-10 years until age 24. However, if early forms of precancerous polyps or small growths are found or if you have a family history or genetic risk for colorectal cancer, you may need to be screened more often.  Skin Cancer  Check your skin from head to toe regularly.  Monitor any moles. Be sure to tell your health care provider: ? About any new moles or changes in moles, especially if there is a change in a mole's shape or color. ? If you have a mole that is larger than the size of a pencil eraser.  If any of your family members has a history of skin cancer, especially at a young age, talk with your health care provider about genetic screening.  Always use sunscreen. Apply sunscreen liberally and repeatedly throughout the day.  Whenever you are outside, protect yourself by wearing long sleeves, pants, a wide-brimmed hat, and sunglasses.  What should I know about osteoporosis? Osteoporosis is a condition in which bone destruction happens more quickly than new bone creation. After menopause, you may be at an increased risk for osteoporosis. To help prevent osteoporosis or the bone fractures that can happen because of osteoporosis, the following is recommended:  If you are 35-64 years old, get at least 1,000 mg of calcium and at least 600 mg of vitamin D per day.  If you are older than age 38 but younger than age 62, get at least 1,200 mg of calcium and at least 600 mg of vitamin D per day.  If you are older than age 32, get at least 1,200 mg of calcium and at least 800 mg of vitamin D per day.  Smoking and excessive alcohol intake increase the risk of osteoporosis. Eat foods that are rich in calcium and vitamin D,  and do weight-bearing exercises several times each week as directed by your health care provider. What should I know about how menopause affects my mental health? Depression may occur at any age, but it is more common as you become older. Common symptoms of depression include:  Low or sad mood.  Changes in sleep patterns.  Changes in appetite or eating patterns.  Feeling an overall lack of motivation or enjoyment of activities that you previously enjoyed.  Frequent crying spells.  Talk with your health care provider if you think that you are experiencing depression. What should I know about immunizations? It is important that you get and maintain your immunizations. These include:  Tetanus, diphtheria, and pertussis (  Tdap) booster vaccine.  Influenza every year before the flu season begins.  Pneumonia vaccine.  Shingles vaccine.  Your health care provider may also recommend other immunizations. This information is not intended to replace advice given to you by your health care provider. Make sure you discuss any questions you have with your health care provider. Document Released: 10/15/2005 Document Revised: 03/12/2016 Document Reviewed: 05/27/2015 Elsevier Interactive Patient Education  2018 [employer].

## 2017-08-23 NOTE — Assessment & Plan Note (Signed)
Annual comprehensive preventive exam was done as well as an evaluation and management of chronic conditions .  During the course of the visit the patient was educated and counseled about appropriate screening and preventive services including :  diabetes screening, lipid analysis with projected  10 year  risk for CAD , nutrition counseling, breast, cervical and colorectal cancer screening, and recommended immunizations.  Printed recommendations for health maintenance screenings was given  Lab Results  Component Value Date   CHOL 191 08/23/2017   HDL 107.00 08/23/2017   LDLCALC 69 08/23/2017   TRIG 72.0 08/23/2017   CHOLHDL 2 08/23/2017   Lab Results  Component Value Date   TSH 0.95 08/23/2017

## 2017-08-23 NOTE — Assessment & Plan Note (Signed)
managed with HRT.  She has no additional risk factors for thrombotic events.  

## 2017-08-23 NOTE — Assessment & Plan Note (Signed)
Her exam is normal.  Likely related to remote prior / shoulder injury

## 2017-08-23 NOTE — Progress Notes (Signed)
Patient ID: Nancy Villegas, female    DOB: [birth year]  Age: 56 y.o. MRN: [id number]  The patient is here for annual PREVENTIVE examination and management of other chronic and acute problems.  FLU VACCINE GIVEN PAP NORMAL 2016 MAMMOGRAM DC 2017 COLONOSCOPY 2014    The risk factors are reflected in the social history.  The roster of all physicians providing medical care to patient - is listed in the Snapshot section of the chart.  Activities of daily living:  The patient is 100% independent in all ADLs: dressing, toileting, feeding as well as independent mobility  Home safety : The patient has smoke detectors in the home. They wear seatbelts.  There are no firearms at home. There is no violence in the home.   There is no risks for hepatitis, STDs or HIV. There is no   history of blood transfusion. They have no travel history to infectious disease endemic areas of the world.  The patient has seen their dentist in the last six month. They have seen their eye doctor in the last year. T    They do not  have excessive sun exposure. Discussed the need for sun protection: hats, long sleeves and use of sunscreen if there is significant sun exposure.   Diet: the importance of a healthy diet is discussed. They do have a healthy diet.She averages 2 eggs per day and is worried because she heard a [facility] clinician recommend a maximum of 2 eggs per week.   The benefits of regular aerobic exercise were discussed. She walks 4 times per week ,  60 minutes.   Depression screen: there are no signs or vegative symptoms of depression- irritability, change in appetite, anhedonia, sadness/tearfullness.  Cognitive assessment: the patient manages all their financial and personal affairs and is actively engaged. They could relate day,date,year and events; recalled 2/3 objects at 3 minutes; performed clock-face test normally.  The following portions of the patient's history were reviewed and updated as  appropriate: allergies, current medications, past family history, past medical history,  past surgical history, past social history  and problem list.  Visual acuity was not assessed per patient preference since she has regular follow up with her ophthalmologist. Hearing and body mass index were assessed and reviewed.   During the course of the visit the patient was educated and counseled about appropriate screening and preventive services including : fall prevention , diabetes screening, nutrition counseling, colorectal cancer screening, and recommended immunizations.    CC: The primary encounter diagnosis was Encounter for preventive health examination. Diagnoses of Need for immunization against influenza, Decreased arm swing amplitude, and Menopausal hot flushes were also pertinent to this visit.  Cc:  Decreased swing of left arm.  Has been noticeably absent for over a year. Denies weakness and numbness..  Remote history of shoulder injury   History Nancy Villegas has a past medical history of Heart murmur, History of chicken pox, and Urine, incontinence, stress female.   She has no past surgical history on file.   Her family history includes COPD in her father; Cancer in her father; Cancer (age of onset: 61) in her mother; Diabetes in her father; Ulcerative colitis in her mother.She reports that  has never smoked. she has never used smokeless tobacco. She reports that she drinks alcohol. She reports that she does not use drugs.  Outpatient Medications Prior to Visit  Medication Sig Dispense Refill  . estradiol (ESTRACE) 2 MG tablet TAKE 1 TABLET (2 MG TOTAL) BY MOUTH DAILY.  90 tablet 1  . progesterone (PROMETRIUM) 200 MG capsule TAKE 1 CAPSULE BY MOUTH DAILY. IN THE EVENING FOR 12 DAYS EVERY MONTH 36 capsule 2  . fluconazole (DIFLUCAN) 150 MG tablet Take 1 tablet (150 mg total) by mouth daily. (Patient not taking: Reported on 08/23/2017) 2 tablet 2   No facility-administered medications prior to  visit.     Review of Systems   Patient denies headache, fevers, malaise, unintentional weight loss, skin rash, eye pain, sinus congestion and sinus pain, sore throat, dysphagia,  hemoptysis , cough, dyspnea, wheezing, chest pain, palpitations, orthopnea, edema, abdominal pain, nausea, melena, diarrhea, constipation, flank pain, dysuria, hematuria, urinary  Frequency, nocturia, numbness, tingling, seizures,  Focal weakness, Loss of consciousness,  Tremor, insomnia, depression, anxiety, and suicidal ideation.      Objective:  BP 122/74 (BP Location: Right Arm, Patient Position: Sitting, Cuff Size: Normal)   Pulse 60   Temp 98.3 F (36.8 C) (Oral)   Resp 15   Ht 5' 5.5" (1.664 m)   Wt 136 lb 9.6 oz (62 kg)   SpO2 98%   BMI 22.39 kg/m   Physical Exam   General appearance: alert, cooperative and appears stated age Head: Normocephalic, without obvious abnormality, atraumatic Eyes: conjunctivae/corneas clear. PERRL, EOM's intact. Fundi benign. Ears: normal TM's and external ear canals both ears Nose: Nares normal. Septum midline. Mucosa normal. No drainage or sinus tenderness. Throat: lips, mucosa, and tongue normal; teeth and gums normal Neck: no adenopathy, no carotid bruit, no JVD, supple, symmetrical, trachea midline and thyroid not enlarged, symmetric, no tenderness/mass/nodules Lungs: clear to auscultation bilaterally Breasts: normal appearance, no masses or tenderness Heart: regular rate and rhythm, S1, S2 normal, no murmur, click, rub or gallop Abdomen: soft, non-tender; bowel sounds normal; no masses,  no organomegaly Extremities: extremities normal, atraumatic, no cyanosis or edema Pulses: 2+ and symmetric Skin: Skin color, texture, turgor normal. No rashes or lesions Neurologic: Alert and oriented X 3, normal strength and tone. Normal symmetric reflexes. Normal coordination and gait.  no tremor    Assessment & Plan:   Problem List Items Addressed This Visit    Decreased  arm swing amplitude    Her exam is normal.  Likely related to remote prior / shoulder injury      Encounter for preventive health examination - Primary    Annual comprehensive preventive exam was done as well as an evaluation and management of chronic conditions .  During the course of the visit the patient was educated and counseled about appropriate screening and preventive services including :  diabetes screening, lipid analysis with projected  10 year  risk for CAD , nutrition counseling, breast, cervical and colorectal cancer screening, and recommended immunizations.  Printed recommendations for health maintenance screenings was given  Lab Results  Component Value Date   CHOL 191 08/23/2017   HDL 107.00 08/23/2017   LDLCALC 69 08/23/2017   TRIG 72.0 08/23/2017   CHOLHDL 2 08/23/2017   Lab Results  Component Value Date   TSH 0.95 08/23/2017         Relevant Orders   Lipid panel (Completed)   TSH (Completed)   CBC with Differential/Platelet (Completed)   Comprehensive metabolic panel (Completed)   Menopausal hot flushes    managed with HRT.  She has no additional risk factors for thrombotic events.        Other Visit Diagnoses    Need for immunization against influenza       Relevant Orders  Flu Vaccine QUAD 36+ mos IM (Completed)      I have discontinued Harley H. Smock's fluconazole. I am also having her start on Zoster Vaccine Adjuvanted. Additionally, I am having her maintain her progesterone and estradiol.  Meds ordered this encounter  Medications  . Zoster Vaccine Adjuvanted [facility](SHINGRIX) injection    Sig: Inject 0.5 mLs into the muscle once for 1 dose.    Dispense:  1 each    Refill:  1    Medications Discontinued During This Encounter  Medication Reason  . fluconazole (DIFLUCAN) 150 MG tablet Completed Course    Follow-up: No Follow-up on file.   Sherlene Shamseresa L Karyme Mcconathy, MD

## 2017-09-08 ENCOUNTER — Ambulatory Visit
Admission: RE | Admit: 2017-09-08 | Discharge: 2017-09-08 | Disposition: A | Discharge status: Home / Self Care | Payer: BLUE CROSS/BLUE SHIELD | Source: Ambulatory Visit | Attending: Internal Medicine | Admitting: Internal Medicine

## 2017-09-08 DIAGNOSIS — Z1231 Encounter for screening mammogram for malignant neoplasm of breast: Secondary | ICD-10-CM | POA: Diagnosis not present

## 2017-12-16 ENCOUNTER — Other Ambulatory Visit: Payer: Self-pay | Admitting: Internal Medicine

## 2017-12-17 ENCOUNTER — Other Ambulatory Visit: Payer: Self-pay | Admitting: Internal Medicine

## 2018-01-06 ENCOUNTER — Telehealth: Payer: Self-pay | Admitting: Internal Medicine

## 2018-01-06 MED ORDER — ESTRADIOL 1 MG PO TABS: 1.0000 mg | ORAL_TABLET | Freq: Every day | ORAL | 0 refills | Status: DC

## 2018-01-06 NOTE — Telephone Encounter (Unsigned)
Copied from CRM [id number]. Topic: Quick Communication - See Telephone Encounter >> Jan 06, 2018 12:45 PM Floria Raveling A wrote: CRM for notification. See Telephone encounter for: 01/06/18. Pt called in stated that she is wanting to try to get off the estradiol (ESTRACE) 2 MG tablet [[id number]] .  She would like DR tullos advice what is the best method to come off of it?

## 2018-01-06 NOTE — Telephone Encounter (Signed)
My Chart message sent

## 2018-01-06 NOTE — Telephone Encounter (Signed)
Please advise 

## 2018-01-09 MED ORDER — ESTRADIOL 1 MG PO TABS: 1.0000 mg | ORAL_TABLET | Freq: Every day | ORAL | 0 refills | Status: DC

## 2018-01-09 NOTE — Telephone Encounter (Signed)
FYI

## 2018-01-09 NOTE — Telephone Encounter (Signed)
Pt is returning call from [place]. CB# [phone]

## 2018-01-09 NOTE — Telephone Encounter (Signed)
Patient called and she was informed of the Mychart message by Dr. Darrick Huntsman. The patient says "for some reason I am locked out of MyChart, so I had no way of reading her message. So, since I didn't hear back, I remembered before how the dosage was tapered, just didn't know how long I should take the reduced dose. I started on 12/26/17 taking what I have, which is 2 mg, every other day. I just didn't know how long I should do every other day." I advised to follow the recommendation of Dr. Darrick Huntsman and take 1 mg every day x 1 month and then she would send in another prescription for a lower dosage. The patient says "I will break my 2 mg in half and take that for 1 month, instead of picking up the 1 mg tablet prescription, then I will need a new prescription for the next dose around June 6th. I will start today." I advised this information will be sent to Dr. Darrick Huntsman.

## 2018-01-09 NOTE — Telephone Encounter (Signed)
Received.  Medication sent for refill in June at 1 mg dose

## 2018-01-09 NOTE — Telephone Encounter (Signed)
Pt wants to make sure she is reducing the dose correctly. So she is supposed to reduce now to  for a month and then to .  for another month?

## 2018-01-09 NOTE — Telephone Encounter (Signed)
LMTCB. Need to read pt the mychart message that Dr. Darrick Huntsman sent her in regards to coming off of the estrace. Mychart message is listed below and has not been read by pt.   PEC may speak with pt.

## 2018-01-09 NOTE — Telephone Encounter (Signed)
Please advise 

## 2018-01-10 NOTE — Telephone Encounter (Signed)
Notified pt that she is correct in how to taper off of her estrace.

## 2018-01-10 NOTE — Telephone Encounter (Signed)
correct 

## 2018-01-17 ENCOUNTER — Encounter: Payer: Self-pay | Admitting: Internal Medicine

## 2018-02-09 ENCOUNTER — Ambulatory Visit: Payer: Self-pay | Admitting: *Deleted

## 2018-02-09 NOTE — Telephone Encounter (Signed)
Reviewed Dr. Melina Schoolsullo's note with patient regarding weaning off estrace. She is now needing to take .5 MG of the Estrace and is requesting a refill of the 1 MG and will cut in half, take for 1 month.  Verified with [facility] pharmacy on file. She has estrace 1 mg on file. Informed patient.

## 2018-04-10 ENCOUNTER — Telehealth: Payer: Self-pay | Admitting: Internal Medicine

## 2018-04-10 ENCOUNTER — Telehealth: Payer: Self-pay

## 2018-04-10 NOTE — Telephone Encounter (Signed)
Copied from CRM [phone]. Topic: Appointment Scheduling - Scheduling Inquiry for Clinic >> Apr 10, 2018 11:48 AM Crist InfanteHarrald, Kathy J wrote: Reason for CRM: pt having leg pain, sharp pain radiating down leg. requesting to see Dr Darrick Huntsmanullo. Please advise. Pt states if she needs to see a different type of dr, she will, but thought needs to start with Dr Darrick Huntsmanullo first

## 2018-04-10 NOTE — Telephone Encounter (Signed)
Copied from CRM [id number]. Topic: Quick Communication - Rx Refill/Question >> Apr 10, 2018 11:43 AM Crist InfanteHarrald, Kathy J wrote: Medication: estradiol (ESTRACE) 1 MG tablet  Pt states she came off this med, but now wants to go back on again. Pt wants to know how she is to take this med going back on this med? Does pt need a new Rx?  [facility] ([id number] [place], [place] - [id number] [phone] (Phone) [phone] (Fax)

## 2018-04-11 ENCOUNTER — Encounter: Payer: Self-pay | Admitting: Internal Medicine

## 2018-04-11 NOTE — Telephone Encounter (Signed)
You can use tomorrow at 5 pm IF SHE will come early  ENOUGH TO GO TO THE LAB TO get plain films of lumbar spine and hip  OWEVER,  .  I CANNOT ORDER  THE X RAYS UNTIL I KNOW WHICH LEG IS AFFECTED.  PLEASE REMIND STAFF TO GET MORE DETAILED INFORMATION

## 2018-04-11 NOTE — Telephone Encounter (Signed)
Spoke with pt and she stated that she will be out of town tomorrow through Friday. Pt has been rescheduled for Monday at 11:30am and appt for tomorrow was canceled. Pt stated that it is left hip and leg pain. Pt is aware that she will be getting xrays when she comes in.

## 2018-04-11 NOTE — Telephone Encounter (Signed)
Yes you can send old rx as refill

## 2018-04-11 NOTE — Telephone Encounter (Signed)
Is it okay to schedule pt in same day tomorrow at 5pm?

## 2018-04-11 NOTE — Telephone Encounter (Signed)
Pt stated that she stopped this medication but would like to restart it. Is it okay to send in?   Refilled: 01/09/2018 Last OV: 08/23/2017 Next OV: not scheduled

## 2018-04-11 NOTE — Telephone Encounter (Signed)
Scheduled patient for appt per Dr. Darrick Huntsmanullo for 04-12-18 at 5:00 , left patient voicemail with this appointment date and time and informed her to call the office back if this date and time does not work for her.

## 2018-04-12 ENCOUNTER — Ambulatory Visit: Payer: BLUE CROSS/BLUE SHIELD | Admitting: Internal Medicine

## 2018-04-12 MED ORDER — ESTRADIOL 1 MG PO TABS: 1.0000 mg | ORAL_TABLET | Freq: Every day | ORAL | 2 refills | Status: DC

## 2018-04-12 NOTE — Telephone Encounter (Signed)
Medication has been faxed to pharmacy. Pt is aware that medication has been sent to pharmacy.

## 2018-04-17 ENCOUNTER — Ambulatory Visit (INDEPENDENT_AMBULATORY_CARE_PROVIDER_SITE_OTHER): Payer: BLUE CROSS/BLUE SHIELD | Admitting: Internal Medicine

## 2018-04-17 ENCOUNTER — Encounter: Payer: Self-pay | Admitting: Internal Medicine

## 2018-04-17 VITALS — BP 128/78 | HR 66 | Temp 98.0°F | Resp 14 | Ht 65.5 in | Wt 138.8 lb

## 2018-04-17 DIAGNOSIS — R251 Tremor, unspecified: Secondary | ICD-10-CM | POA: Diagnosis not present

## 2018-04-17 DIAGNOSIS — M79672 Pain in left foot: Secondary | ICD-10-CM

## 2018-04-17 DIAGNOSIS — G969 Disorder of central nervous system, unspecified: Secondary | ICD-10-CM | POA: Diagnosis not present

## 2018-04-17 DIAGNOSIS — R200 Anesthesia of skin: Secondary | ICD-10-CM | POA: Diagnosis not present

## 2018-04-17 DIAGNOSIS — R29898 Other symptoms and signs involving the musculoskeletal system: Secondary | ICD-10-CM

## 2018-04-17 DIAGNOSIS — R270 Ataxia, unspecified: Secondary | ICD-10-CM | POA: Diagnosis not present

## 2018-04-17 DIAGNOSIS — R202 Paresthesia of skin: Secondary | ICD-10-CM

## 2018-04-17 DIAGNOSIS — E538 Deficiency of other specified B group vitamins: Secondary | ICD-10-CM

## 2018-04-17 DIAGNOSIS — M79605 Pain in left leg: Secondary | ICD-10-CM

## 2018-04-17 LAB — VITAMIN B12: VITAMIN B 12: 262 pg/mL (ref 211–911)

## 2018-04-17 NOTE — Progress Notes (Signed)
Subjective:  Patient ID: Nancy Villegas, female    DOB: 09/13/1960  Age: 57 y.o. MRN: [id number]  CC: The primary encounter diagnosis was Tremor due to disorder of central nervous system. Diagnoses of Numbness and tingling in left arm, Mechanical pain of left foot, Ataxia of left upper extremity, Decreased arm swing amplitude, Leg pain, diffuse, left, and B12 deficiency were also pertinent to this visit.  HPI Nancy MaskLeigh Hunt Bezdek presents for evaluation of left lower extremity pain. Started with pain on the  ball of her foot with weight bearing, feeling like she was standing on a  Pebble,   accompaned by pain and dysesthesias that radiate proximally and laterally.  Has been present for several months .  Recently she developed pain in the left lateral lower leg which she describes as tightness,  And last week the pain has spread proximally to the lateral hip.   The hip is painful  To lie on and leg/pain s aggravated by walking ,  It has caused her to stop walking/running.  She denies swelling and redness,  No history of recent travel except to the beach. No histor yof DVT    left arm tremor  Still present ,  Feels that left arm is not as coordinated as the right,  Finds herself changing grips to right hand   Tremor and perceived weakness tremor occurs with outstrecthed arm and with use of hand,  Not at rest.  Notices left arm doesn't swing  When she walks    Had an evaluation for ataxia by Dr Sherryll BurgerShah in 2017 for same.  MRI was done : normal   Alcohol and diet reviewed:  Omnivore.  Averages one glass of wine daily,   2 to 3 on weekends (one night only)   Outpatient Medications Prior to Visit  Medication Sig Dispense Refill  . estradiol (ESTRACE) 1 MG tablet Take 1 tablet (1 mg total) by mouth daily. 30 tablet 2  . progesterone (PROMETRIUM) 200 MG capsule TAKE 1 CAPSULE BY MOUTH DAILY. IN THE EVENING FOR 12 DAYS EVERY MONTH 36 capsule 2   No facility-administered medications prior to visit.     Review  of Systems;  Patient denies headache, fevers, malaise, unintentional weight loss, skin rash, eye pain, sinus congestion and sinus pain, sore throat, dysphagia,  hemoptysis , cough, dyspnea, wheezing, chest pain, palpitations, orthopnea, edema, abdominal pain, nausea, melena, diarrhea, constipation, flank pain, dysuria, hematuria, urinary  Frequency, nocturia, numbness, tingling, seizures,  Focal weakness, Loss of consciousness,  Tremor, insomnia, depression, anxiety, and suicidal ideation.      Objective:  BP 128/78 (BP Location: Right Arm, Patient Position: Sitting, Cuff Size: Normal)   Pulse 66   Temp 98 F (36.7 C) (Oral)   Resp 14   Ht 5' 5.5" (1.664 m)   Wt 138 lb 12.8 oz (63 kg)   SpO2 98%   BMI 22.75 kg/m   BP Readings from Last 3 Encounters:  04/17/18 128/78  08/23/17 122/74  09/02/16 108/66    Wt Readings from Last 3 Encounters:  04/17/18 138 lb 12.8 oz (63 kg)  08/23/17 136 lb 9.6 oz (62 kg)  09/02/16 136 lb 4 oz (61.8 kg)    General appearance: alert, cooperative and appears stated age Back: symmetric, no curvature. ROM normal. No CVA tenderness. Lungs: clear to auscultation bilaterally Heart: regular rate and rhythm, S1, S2 normal, no murmur, click, rub or gallop Abdomen: soft, non-tender; bowel sounds normal; no masses,  no organomegaly Pulses:  2+ and symmetric Skin: Skin color, texture, turgor normal. No rashes or lesions Lymph nodes: Cervical, supraclavicular, and axillary nodes normal. Neuro: CNs 2-12 intact. DTRs 2+/4 in biceps, brachioradialis, patellars and achilles. Muscle strength 5/5 in upper and lower exremities. Fine intention  Tremor on the left only.  Cerebellar function normal. Romberg negative.  No pronator drift.   Gait normal MSK:  Bunion left foot . Some pain with squeezing toes..  No masses,  No calf pain,  Negative Homan's sign.  Sensation intact to light touch using microfilament.  No results found for: HGBA1C  Lab Results  Component Value  Date   CREATININE 0.64 08/23/2017   CREATININE 0.59 06/02/2016   CREATININE 0.57 07/11/2015    Lab Results  Component Value Date   WBC 6.4 08/23/2017   HGB 12.7 08/23/2017   HCT 37.6 08/23/2017   PLT 241.0 08/23/2017   GLUCOSE 115 (H) 08/23/2017   CHOL 191 08/23/2017   TRIG 72.0 08/23/2017   HDL 107.00 08/23/2017   LDLCALC 69 08/23/2017   ALT 12 08/23/2017   AST 17 08/23/2017   NA 137 08/23/2017   K 4.0 08/23/2017   CL 101 08/23/2017   CREATININE 0.64 08/23/2017   BUN 23 08/23/2017   CO2 30 08/23/2017   TSH 0.95 08/23/2017    Mm Screening Breast Tomo Bilateral  Result Date: 09/08/2017 CLINICAL DATA:  Screening. EXAM: 2D DIGITAL SCREENING BILATERAL MAMMOGRAM WITH 3D TOMO WITH CAD COMPARISON:  Previous exam(s). ACR Breast Density Category d: The breast tissue is extremely dense, which lowers the sensitivity of mammography. FINDINGS: There are no findings suspicious for malignancy. Images were processed with CAD. IMPRESSION: No mammographic evidence of malignancy. A result letter of this screening mammogram will be mailed directly to the patient. RECOMMENDATION: Screening mammogram in one year. (Code:SM-B-01Y) BI-RADS CATEGORY  1: Negative. Electronically Signed   By: Hulan Saashomas  Lawrence M.D.   On: 09/08/2017 09:30    Assessment & Plan:   Problem List Items Addressed This Visit    Decreased arm swing amplitude    Accompanied by intention tremor of left hand.  MRI done in 2017 for same was normal Sherryll Burger(Shah).   DDX includes early onset arkinson's Disease Referral to Dr Lurena Joinerebecca Tat for second opinion       Leg pain, diffuse, left    I suspect her foot pain may be due to bunion vs Morton's neuroma, and her change in gait has resulted in muscle strain and IT band  Issues.  Podiatry referral to [facility] .  Will supplement B12 orally       B12 deficiency    Borderline.  In the setting of neurologic complaints will supplement       Other Visit Diagnoses    Tremor due to disorder of  central nervous system    -  Primary   Relevant Orders   RBC Folate (Completed)   Vitamin B12 (Completed)   Ambulatory referral to Neurology   Numbness and tingling in left arm       Relevant Orders   RBC Folate (Completed)   Vitamin B12 (Completed)   Mechanical pain of left foot       Relevant Orders   Ambulatory referral to Podiatry   Ataxia of left upper extremity       Relevant Orders   Ambulatory referral to Neurology      I am having Lucilla LameLeigh H. Loeper maintain her progesterone and estradiol.  No orders of the defined types were placed in  this encounter.   There are no discontinued medications.  Follow-up: No follow-ups on file.   Crecencio Mc, MD

## 2018-04-17 NOTE — Patient Instructions (Signed)
I am making the following referrals;  Referral to Dr Orland [facility]redroxler for evaluation of foot pain (buinion vs morton's neuroma)  [facility] tremor and decreased swing   Checking b12 and folate levels today

## 2018-04-18 DIAGNOSIS — E538 Deficiency of other specified B group vitamins: Secondary | ICD-10-CM | POA: Insufficient documentation

## 2018-04-18 DIAGNOSIS — M79605 Pain in left leg: Secondary | ICD-10-CM | POA: Insufficient documentation

## 2018-04-18 LAB — FOLATE RBC: RBC Folate: 1047 ng/mL RBC (ref >280)

## 2018-04-18 NOTE — Assessment & Plan Note (Signed)
Accompanied by intention tremor of left hand.  MRI done in 2017 for same was normal Sherryll Burger(Shah).   DDX includes early onset arkinson's Disease Referral to Dr Lurena Joinerebecca Tat for second opinion

## 2018-04-18 NOTE — Assessment & Plan Note (Addendum)
I suspect her foot pain may be due to bunion vs Morton's neuroma, and her change in gait has resulted in muscle strain and IT band  Issues.  Podiatry referral to [facility] .  Will supplement B12 orally

## 2018-04-18 NOTE — Assessment & Plan Note (Signed)
Borderline.  In the setting of neurologic complaints will supplement

## 2018-04-21 ENCOUNTER — Encounter: Payer: Self-pay | Admitting: Neurology

## 2018-05-05 NOTE — Progress Notes (Signed)
Nancy Villegas was seen today in the movement disorders clinic for neurologic consultation at the request of Sherlene Shamsullo, Teresa L, MD.  The consultation is for the evaluation of L hand tremor. Pt, however, denies the existence of tremor.  States that about 2 years ago this started after she had frozen shoulder.  There is no pain.  She reports that the L hand and arm are uncoordinated.  She notes that she doesn't swing it when she walks.  She also notes flexion of the L arm with walking.  When she washes her hair, she notes that the L hand doesn't move like the right.  She has pain in the left leg/foot and she awakens with a lot of pain in the foot.  She has some paresthesias of the L foot.  Has an upcoming podiatry appt.     The records that were made available to me were reviewed.  Pt previously saw Dr. Sherryll BurgerShah for balance change and dizziness.  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  I reviewed this.  It was completed on June 18, 2016.  This was done with and without gadolinium.  It was normal.  Patient had extensive lab testing as well which was unremarkable.  When she followed up in October, 2017 her symptoms had improved.  She did have diplopia at the time but resolved after few weeks.   Other Specific Symptoms:  Tremor: rarely with putting hand to mouth.   Fam Hx of PD: yes, mother Voice: no change Sleep: trouble staying asleep (hot flashes)  Vivid Dreams:  No.  Acting out dreams:  No. Wet Pillows: No. Postural symptoms:  Yes.   - mild, L leg feels "weak" and painful.    Falls?  No. Bradykinesia symptoms: smaller steps due to L leg pain Loss of smell:  No. Loss of taste:  No. Urinary Incontinence:  No. Difficulty Swallowing:  No. Handwriting, micrographia: No. Trouble with ADL's:  No.  Trouble buttoning clothing: No. Depression:  No. Memory changes:  No. Hallucinations:  No.  visual distortions: Yes.  , rarely N/V:  No. Lightheaded:   No.  Syncope: No. Diplopia:  No. Dyskinesia:  No.  PREVIOUS MEDICATIONS: none to date  ALLERGIES:  No Known Allergies  CURRENT MEDICATIONS:  Outpatient Encounter Medications as of 05/09/2018  Medication Sig  . estradiol (ESTRACE) 1 MG tablet Take 1 tablet (1 mg total) by mouth daily.  . progesterone (PROMETRIUM) 200 MG capsule TAKE 1 CAPSULE BY MOUTH DAILY. IN THE EVENING FOR 12 DAYS EVERY MONTH   No facility-administered encounter medications on file as of 05/09/2018.     PAST MEDICAL HISTORY:   Past Medical History:  Diagnosis Date  . Heart murmur   . History of chicken pox   . Urine, incontinence, stress female    leaking with laughter or very full bladder    PAST SURGICAL HISTORY:  No past surgical history on file.  SOCIAL HISTORY:   Social History   Socioeconomic History  . Marital status: Married    Spouse name: Not on file  . Number of children: Not on file  . Years of education: Not on file  . Highest education level: Not on file  Occupational History  . Not on file  Social Needs  . Financial resource strain: Not on file  . Food insecurity:    Worry: Not on file    Inability: Not on file  . Transportation needs:  Medical: Not on file    Non-medical: Not on file  Tobacco Use  . Smoking status: Never Smoker  . Smokeless tobacco: Never Used  Substance and Sexual Activity  . Alcohol use: Yes    Comment: Regular (1 beer/daily)  . Drug use: No  . Sexual activity: Yes  Lifestyle  . Physical activity:    Days per week: Not on file    Minutes per session: Not on file  . Stress: Not on file  Relationships  . Social connections:    Talks on phone: Not on file    Gets together: Not on file    Attends religious service: Not on file    Active member of club or organization: Not on file    Attends meetings of clubs or organizations: Not on file    Relationship status: Not on file  . Intimate partner violence:    Fear of current or ex partner: Not on file     Emotionally abused: Not on file    Physically abused: Not on file    Forced sexual activity: Not on file  Other Topics Concern  . Not on file  Social History Narrative  . Not on file    FAMILY HISTORY:   Family Status  Relation Name Status  . Mother  Deceased  . Father  Deceased  . Sister  Alive  . Brother  Alive    ROS:  Review of Systems  Constitutional: Negative.   HENT: Negative.   Eyes: Negative.   Respiratory: Negative.   Cardiovascular: Negative.   Gastrointestinal: Negative.   Genitourinary: Negative.   Musculoskeletal: Negative.   Skin: Negative.   Neurological: Negative.   Endo/Heme/Allergies: Negative.   Psychiatric/Behavioral: Negative.     PHYSICAL EXAMINATION:    VITALS:  There were no vitals filed for this visit.  GEN:  The patient appears stated age and is in NAD. HEENT:  Normocephalic, atraumatic.  The mucous membranes are moist. The superficial temporal arteries are without ropiness or tenderness. CV:  RRR Lungs:  CTAB Neck/HEME:  There are no carotid bruits bilaterally.  Neurological examination:  Orientation: The patient is alert and oriented x3. Fund of knowledge is appropriate.  Recent and remote memory are intact.  Attention and concentration are normal.    Able to name objects and repeat phrases. Cranial nerves: There is good facial symmetry. Pupils are equal round and reactive to light bilaterally. Fundoscopic exam reveals clear margins bilaterally. Extraocular muscles are intact. The visual fields are full to confrontational testing. The speech is fluent and clear. Soft palate rises symmetrically and there is no tongue deviation. Hearing is intact to conversational tone. Sensation: Sensation is intact to light and pinprick throughout (facial, trunk, extremities). Vibration is intact at the bilateral big toe. There is no extinction with double simultaneous stimulation. There is no sensory dermatomal level identified. Motor: Strength is 5/5  in the bilateral upper and lower extremities.   Shoulder shrug is equal and symmetric.  There is no pronator drift. Deep tendon reflexes: Deep tendon reflexes are 2/4 at the bilateral biceps, triceps, brachioradialis, patella and achilles. Plantar responses are downgoing bilaterally.  Movement examination: Tone: There is mod increased tone in the LUE/LLE Abnormal movements: none even with distraction Coordination:  There is  decremation with RAM's, with any form of RAMS, including alternating supination and pronation of the forearm, hand opening and closing, finger taps, heel taps and toe taps on the L, more in the arms than legs.  RAMs  on the right are normal Gait and Station: The patient has no difficulty arising out of a deep-seated chair without the use of the hands. The patient's stride length is good with decreased arm swing on the L.  The patient has a neg pull test.      Labs: As above, the patient had extensive laboratory testing in October 2017 with Dr. Sherryll Burger.  He had a normal TSH.  B12 was 560.  Sedimentation rate was 35.  ANA was negative.  Heavy metal profile was negative.  Lyme was negative.  ACE was normal.  There was no M spike in the protein electrophoresis.  Lab Results  Component Value Date   VITAMINB12 262 04/17/2018   Lab Results  Component Value Date   WBC 6.4 08/23/2017   HGB 12.7 08/23/2017   HCT 37.6 08/23/2017   MCV 97.0 08/23/2017   PLT 241.0 08/23/2017     ASSESSMENT/PLAN:  1.  Parkinsonism.  I suspect that this does represent idiopathic Parkinson's disease.    -We discussed the diagnosis as well as pathophysiology of the disease.  We discussed treatment options as well as prognostic indicators.  Patient education was provided.  -We discussed that it used to be thought that levodopa would increase risk of melanoma but now it is believed that Parkinsons itself likely increases risk of melanoma. she is to get regular skin checks.  -Greater than 50% of the 80  minute visit was spent in counseling answering questions and talking about what to expect now as well as in the future.  We talked about medication options as well as potential future surgical options.  We talked about safety in the home.  -We decided to add pramipexole and work to 0.5 mg tid.  Risks, benefits, side effects and alternative therapies were discussed.  The opportunity to ask questions was given and they were answered to the best of my ability.  The patient expressed understanding and willingness to follow the outlined treatment protocols.  -We discussed community resources in the area including patient support groups and community exercise programs for PD and pt education was provided to the patient.  Social worker provided as support.     2.  b12 deficiency  -When she saw Dr. Sherryll Burger in 2017, she was not deficient but she is now.  Discussed that we would like to see that number over 400 and hers is 262.  Recommended over-the-counter B12 supplement, 1000 mcg daily.  3. EtOH use (not abuse)  -does drink EtOH daily.  Consider tapering due to need for liver to process meds daily.  4.  Follow up is anticipated in the next few months, sooner should new neurologic issues arise.    Cc:  Sherlene Shams, MD

## 2018-05-09 ENCOUNTER — Encounter: Payer: Self-pay | Admitting: Psychology

## 2018-05-09 ENCOUNTER — Ambulatory Visit: Payer: BLUE CROSS/BLUE SHIELD | Admitting: Neurology

## 2018-05-09 ENCOUNTER — Encounter: Payer: Self-pay | Admitting: Neurology

## 2018-05-09 ENCOUNTER — Ambulatory Visit (INDEPENDENT_AMBULATORY_CARE_PROVIDER_SITE_OTHER): Payer: BLUE CROSS/BLUE SHIELD | Admitting: Neurology

## 2018-05-09 VITALS — BP 114/64 | HR 64 | Ht 66.0 in | Wt 138.0 lb

## 2018-05-09 DIAGNOSIS — Z789 Other specified health status: Secondary | ICD-10-CM | POA: Diagnosis not present

## 2018-05-09 DIAGNOSIS — E538 Deficiency of other specified B group vitamins: Secondary | ICD-10-CM

## 2018-05-09 DIAGNOSIS — G20 Parkinson's disease: Secondary | ICD-10-CM | POA: Diagnosis not present

## 2018-05-09 DIAGNOSIS — Z7289 Other problems related to lifestyle: Secondary | ICD-10-CM

## 2018-05-09 MED ORDER — PRAMIPEXOLE DIHYDROCHLORIDE 0.125 MG PO TABS: ORAL_TABLET | ORAL | 0 refills | Status: DC

## 2018-05-09 MED ORDER — PRAMIPEXOLE DIHYDROCHLORIDE 0.5 MG PO TABS: 0.5000 mg | ORAL_TABLET | Freq: Three times a day (TID) | ORAL | 5 refills | Status: DC

## 2018-05-09 NOTE — Progress Notes (Addendum)
I met with the patient briefly today while she was in the clinic.  She did not have her husband with her at this appointment and was a bit overwhelmed at the diagnosis.  We talked about giving herself some grace this week while she processes this information. We talked a little about good places to go for information on PD and to walk through this information slowly and in an intentional way that will be fair to herself and her disease.  To remind herself where she is with it, not where others are with thiers. Lastly, we discussed disclosure as she brought this up.  I recommend that she talk with her support partner and give herself time to process as she will know later who and how and if she decides to let others know.     I have some resources that would be positive for her such as the young onset Parkinson's disease group and will plan to give this information to her in more detail in the next couple weeks.  The plan is is that she will contact me within the week so I can schedule a time to meet with both she and her husband to go over resources and information on living well with Parkinson's disease.  In addition, I talked about being able to provide her with some resources for counseling at that time as she may find this helpful as she adjusts to her diagnosis.  I provided my contact information to the patient with the plan that she will contact me to schedule a time to meet.

## 2018-05-09 NOTE — Patient Instructions (Signed)
1. Start mirapex (pramipexole) as follows:  0.125 mg - 1 tablet three times per day for a week, then 2 tablets three times per day for a week and then fill the 0.5 mg tablet and take that, 1 pill three times per day  

## 2018-05-12 ENCOUNTER — Telehealth: Payer: Self-pay | Admitting: Neurology

## 2018-05-12 NOTE — Telephone Encounter (Signed)
Referral faxed to [facility] at [phone] with confirmation received.

## 2018-05-12 NOTE — Telephone Encounter (Signed)
Left message on machine for patient to call back.  TO let her know we will send a referral and they will call her directly.

## 2018-05-12 NOTE — Telephone Encounter (Signed)
Fine with me.  May want to hold meds for their visit

## 2018-05-12 NOTE — Telephone Encounter (Signed)
Please advise if okay to send.

## 2018-05-12 NOTE — Telephone Encounter (Signed)
Patient called and is wanting to have a 2nd opinion with [facility]. She said she will be back to see Dr. Arbutus Leas but she also wanted a 2nd opinion. She was asking could her last office note and a referral to [facility]. Thanks

## 2018-05-29 DIAGNOSIS — Q6621 Congenital metatarsus primus varus: Secondary | ICD-10-CM | POA: Diagnosis not present

## 2018-05-29 DIAGNOSIS — M779 Enthesopathy, unspecified: Secondary | ICD-10-CM | POA: Diagnosis not present

## 2018-06-08 DIAGNOSIS — G20 Parkinson's disease: Secondary | ICD-10-CM | POA: Diagnosis not present

## 2018-06-11 ENCOUNTER — Other Ambulatory Visit: Payer: Self-pay | Admitting: Internal Medicine

## 2018-06-11 DIAGNOSIS — M79605 Pain in left leg: Secondary | ICD-10-CM

## 2018-06-13 DIAGNOSIS — M7062 Trochanteric bursitis, left hip: Secondary | ICD-10-CM | POA: Diagnosis not present

## 2018-06-13 DIAGNOSIS — M25562 Pain in left knee: Secondary | ICD-10-CM | POA: Diagnosis not present

## 2018-06-14 NOTE — Telephone Encounter (Signed)
I NEED TO CANCEL THE ORTHOPEDIC REFERRAL.   Patient 's husband scheduled her with someone in [place]

## 2018-07-12 ENCOUNTER — Ambulatory Visit: Payer: Self-pay

## 2018-07-12 ENCOUNTER — Ambulatory Visit (INDEPENDENT_AMBULATORY_CARE_PROVIDER_SITE_OTHER): Payer: BLUE CROSS/BLUE SHIELD | Admitting: Podiatry

## 2018-07-12 ENCOUNTER — Encounter: Payer: Self-pay | Admitting: Podiatry

## 2018-07-12 DIAGNOSIS — M2012 Hallux valgus (acquired), left foot: Secondary | ICD-10-CM

## 2018-07-12 DIAGNOSIS — M545 Low back pain: Secondary | ICD-10-CM | POA: Diagnosis not present

## 2018-07-12 DIAGNOSIS — M7062 Trochanteric bursitis, left hip: Secondary | ICD-10-CM | POA: Diagnosis not present

## 2018-07-12 DIAGNOSIS — M25552 Pain in left hip: Secondary | ICD-10-CM | POA: Diagnosis not present

## 2018-07-12 MED ORDER — METHYLPREDNISOLONE 4 MG PO TABS: ORAL_TABLET | ORAL | 0 refills | Status: DC

## 2018-07-12 MED ORDER — MELOXICAM 15 MG PO TABS: 15.0000 mg | ORAL_TABLET | Freq: Every day | ORAL | 3 refills | Status: DC

## 2018-07-12 NOTE — Progress Notes (Signed)
  Subjective:  Patient ID: Nancy Villegas, female    DOB: 07-Mar-1961,  MRN: [id number] HPI Chief Complaint  Patient presents with  . Foot Pain    Patient presents today for painful bunion and top of right foot pain x 3-4 months.  She states "the pain is sharp and feels like a cramp at times"  She reports the pain wakes her up at night and also has numbness, tingling on the top of her foot which comes and goes.  She does not take anything for treatment    57 y.o. female presents with the above complaint.   ROS: Denies fever chills nausea vomiting muscle aches pains calf pain back pain chest pain shortness of breath.  Past Medical History:  Diagnosis Date  . Heart murmur   . History of chicken pox   . Urine, incontinence, stress female    leaking with laughter or very full bladder   Past Surgical History:  Procedure Laterality Date  . NO PAST SURGERIES      Current Outpatient Medications:  .  estradiol (ESTRACE) 1 MG tablet, Take 1 tablet (1 mg total) by mouth daily., Disp: 30 tablet, Rfl: 2 .  meloxicam (MOBIC) 15 MG tablet, Take 1 tablet (15 mg total) by mouth daily., Disp: 30 tablet, Rfl: 3 .  methylPREDNISolone (MEDROL) 4 MG tablet, Take as directed, Disp: 21 tablet, Rfl: 0 .  progesterone (PROMETRIUM) 200 MG capsule, TAKE 1 CAPSULE BY MOUTH DAILY. IN THE EVENING FOR 12 DAYS EVERY MONTH, Disp: 36 capsule, Rfl: 2  No Known Allergies Review of Systems Objective:  There were no vitals filed for this visit.  General: Well developed, nourished, in no acute distress, alert and oriented x3   Dermatological: Skin is warm, dry and supple bilateral. Nails x 10 are well maintained; remaining integument appears unremarkable at this time. There are no open sores, no preulcerative lesions, no rash or signs of infection present.  Vascular: Dorsalis Pedis artery and Posterior Tibial artery pedal pulses are 2/4 bilateral with immedate capillary fill time. Pedal hair growth present. No  varicosities and no lower extremity edema present bilateral.   Neruologic: Grossly intact via light touch bilateral. Vibratory intact via tuning fork bilateral. Protective threshold with Semmes Wienstein monofilament intact to all pedal sites bilateral. Patellar and Achilles deep tendon reflexes 2+ bilateral. No Babinski or clonus noted bilateral.   Musculoskeletal: No gross boney pedal deformities bilateral. No pain, crepitus, or limitation noted with foot and ankle range of motion bilateral. Muscular strength 5/5 in all groups tested bilateral.  Gait: Unassisted, Nonantalgic.    Radiographs:  Radiographs demonstrate osteoarthritic changes of the first metatarsal phalangeal joint of the bilateral foot left greater than right most likely secondary to elongated elevated first metatarsal.  There is a small spur on the dorsal aspect of the first metatarsophalangeal joint.  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  Raiford Noble will make her set of orthotics to offload her first metatarsal angle joint secondary to hallux valgus and will also create a pocket for the second metatarsal phalangeal joint plantarly.      T. [place], [place]

## 2018-07-26 ENCOUNTER — Ambulatory Visit (INDEPENDENT_AMBULATORY_CARE_PROVIDER_SITE_OTHER): Payer: BLUE CROSS/BLUE SHIELD | Admitting: Orthotics

## 2018-07-26 ENCOUNTER — Telehealth: Payer: Self-pay | Admitting: *Deleted

## 2018-07-26 DIAGNOSIS — E538 Deficiency of other specified B group vitamins: Secondary | ICD-10-CM

## 2018-07-26 DIAGNOSIS — Z0000 Encounter for general adult medical examination without abnormal findings: Secondary | ICD-10-CM

## 2018-07-26 DIAGNOSIS — M79671 Pain in right foot: Secondary | ICD-10-CM

## 2018-07-26 DIAGNOSIS — M2012 Hallux valgus (acquired), left foot: Secondary | ICD-10-CM

## 2018-07-26 NOTE — Telephone Encounter (Signed)
Ordered CBC, CMP, Lipid, TSH. Is there anything else that needs to be ordered?

## 2018-07-26 NOTE — Telephone Encounter (Signed)
Thank you!  b12 and IF added

## 2018-07-26 NOTE — Telephone Encounter (Signed)
Copied from CRM [id number]. Topic: General - Other >> Jul 26, 2018  1:04 PM Gaynelle AduPoole, Shalonda wrote: Reason for EAV:[id number]:patient is requesting blood work order. Please advise

## 2018-07-26 NOTE — Progress Notes (Signed)
Patient was cast to day for CMFO per Dr. Milinda Pointer.  Patient has hav b/l left more than right.  Patient also has spurring left mpj.  Plan on Cmfo with 1st met cut out and R Mortons extension and 2nd met offload b/l.

## 2018-07-28 NOTE — Telephone Encounter (Signed)
Spoke with pt and scheduled her a fasting lab appt. Pt is aware of appt date and time.  

## 2018-07-31 ENCOUNTER — Other Ambulatory Visit: Payer: Self-pay | Admitting: Internal Medicine

## 2018-08-02 ENCOUNTER — Other Ambulatory Visit (INDEPENDENT_AMBULATORY_CARE_PROVIDER_SITE_OTHER): Payer: BLUE CROSS/BLUE SHIELD

## 2018-08-02 ENCOUNTER — Telehealth: Payer: Self-pay | Admitting: Internal Medicine

## 2018-08-02 DIAGNOSIS — Z0000 Encounter for general adult medical examination without abnormal findings: Secondary | ICD-10-CM | POA: Diagnosis not present

## 2018-08-02 DIAGNOSIS — E538 Deficiency of other specified B group vitamins: Secondary | ICD-10-CM | POA: Diagnosis not present

## 2018-08-02 DIAGNOSIS — Z0279 Encounter for issue of other medical certificate: Secondary | ICD-10-CM

## 2018-08-02 LAB — COMPREHENSIVE METABOLIC PANEL
ALBUMIN: 4.4 g/dL (ref 3.5–5.2)
ALT: 10 U/L (ref 0–35)
AST: 17 U/L (ref 0–37)
Alkaline Phosphatase: 34 U/L — ABNORMAL LOW (ref 39–117)
BUN: 19 mg/dL (ref 6–23)
CALCIUM: 9.6 mg/dL (ref 8.4–10.5)
CHLORIDE: 102 meq/L (ref 96–112)
CO2: 29 mEq/L (ref 19–32)
Creatinine, Ser: 0.61 mg/dL (ref 0.40–1.20)
GFR: 107.62 mL/min (ref >60.00)
Glucose, Bld: 103 mg/dL — ABNORMAL HIGH (ref 70–99)
POTASSIUM: 3.8 meq/L (ref 3.5–5.1)
Sodium: 138 mEq/L (ref 135–145)
Total Bilirubin: 0.7 mg/dL (ref 0.2–1.2)
Total Protein: 7.1 g/dL (ref 6.0–8.3)

## 2018-08-02 LAB — LIPID PANEL
CHOLESTEROL: 220 mg/dL — AB (ref 0–200)
HDL: 117 mg/dL (ref >39.00)
LDL Cholesterol: 90 mg/dL (ref 0–99)
NONHDL: 102.88
TRIGLYCERIDES: 63 mg/dL (ref 0.0–149.0)
Total CHOL/HDL Ratio: 2
VLDL: 12.6 mg/dL (ref 0.0–40.0)

## 2018-08-02 LAB — CBC WITH DIFFERENTIAL/PLATELET
BASOS PCT: 0.5 % (ref 0.0–3.0)
Basophils Absolute: 0 10*3/uL (ref 0.0–0.1)
EOS PCT: 0.4 % (ref 0.0–5.0)
Eosinophils Absolute: 0 10*3/uL (ref 0.0–0.7)
HEMATOCRIT: 36 % (ref 36.0–46.0)
HEMOGLOBIN: 12.4 g/dL (ref 12.0–15.0)
Lymphocytes Relative: 21.6 % (ref 12.0–46.0)
Lymphs Abs: 1.3 10*3/uL (ref 0.7–4.0)
MCHC: 34.4 g/dL (ref 30.0–36.0)
MCV: 97.4 fl (ref 78.0–100.0)
MONOS PCT: 5.4 % (ref 3.0–12.0)
Monocytes Absolute: 0.3 10*3/uL (ref 0.1–1.0)
Neutro Abs: 4.3 10*3/uL (ref 1.4–7.7)
Neutrophils Relative %: 72.1 % (ref 43.0–77.0)
Platelets: 239 10*3/uL (ref 150.0–400.0)
RBC: 3.7 Mil/uL — ABNORMAL LOW (ref 3.87–5.11)
RDW: 12.9 % (ref 11.5–15.5)
WBC: 5.9 10*3/uL (ref 4.0–10.5)

## 2018-08-02 LAB — TSH: TSH: 1.16 u[IU]/mL (ref 0.35–4.50)

## 2018-08-02 LAB — VITAMIN B12: Vitamin B-12: 787 pg/mL (ref 211–911)

## 2018-08-02 NOTE — Telephone Encounter (Signed)
Pt dropped off a insurance form that needs to be filled out by [employer]. Form is up front in color folder.

## 2018-08-02 NOTE — Telephone Encounter (Signed)
Placed in quick sign folder.  

## 2018-08-05 LAB — INTRINSIC FACTOR ANTIBODIES: Intrinsic Factor: NEGATIVE

## 2018-08-09 NOTE — Telephone Encounter (Signed)
Form has been completed, signed and faxed. Copy has been placed up front for pt. Pt is aware.

## 2018-08-10 ENCOUNTER — Other Ambulatory Visit: Payer: Self-pay | Admitting: Internal Medicine

## 2018-08-10 DIAGNOSIS — Z1231 Encounter for screening mammogram for malignant neoplasm of breast: Secondary | ICD-10-CM

## 2018-08-16 ENCOUNTER — Other Ambulatory Visit: Payer: Self-pay | Admitting: Internal Medicine

## 2018-08-16 ENCOUNTER — Ambulatory Visit (INDEPENDENT_AMBULATORY_CARE_PROVIDER_SITE_OTHER): Payer: BLUE CROSS/BLUE SHIELD | Admitting: Orthotics

## 2018-08-16 DIAGNOSIS — M2012 Hallux valgus (acquired), left foot: Secondary | ICD-10-CM

## 2018-08-16 NOTE — Progress Notes (Signed)
Patient came in today to pick up custom made foot orthotics.  The goals were accomplished and the patient reported no dissatisfaction with said orthotics.  Patient was advised of breakin period and how to report any issues. 

## 2018-08-16 NOTE — Telephone Encounter (Signed)
Copied from CRM [phone]. Topic: General - Other >> Aug 16, 2018  9:27 AM Leafy Roobinson, Norma J wrote: Reason for CRM: pt is calling and has switched pharm and needs progesterone 200 mg send to [facility] [address] in [place]. Pt does not have any more refills so pharm can no transfer to [facility]

## 2018-08-17 MED ORDER — PROGESTERONE MICRONIZED 200 MG PO CAPS: ORAL_CAPSULE | ORAL | 2 refills | Status: DC

## 2018-09-04 ENCOUNTER — Other Ambulatory Visit: Payer: Self-pay | Admitting: Internal Medicine

## 2018-09-11 ENCOUNTER — Encounter: Payer: Self-pay | Admitting: Internal Medicine

## 2018-09-11 ENCOUNTER — Other Ambulatory Visit (HOSPITAL_COMMUNITY)
Admission: RE | Admit: 2018-09-11 | Discharge: 2018-09-11 | Disposition: A | Discharge status: Home / Self Care | Payer: BLUE CROSS/BLUE SHIELD | Source: Ambulatory Visit | Attending: Internal Medicine | Admitting: Internal Medicine

## 2018-09-11 ENCOUNTER — Ambulatory Visit (INDEPENDENT_AMBULATORY_CARE_PROVIDER_SITE_OTHER): Payer: BLUE CROSS/BLUE SHIELD | Admitting: Internal Medicine

## 2018-09-11 VITALS — BP 116/70 | HR 72 | Temp 97.8°F | Resp 14 | Ht 66.0 in | Wt 138.8 lb

## 2018-09-11 DIAGNOSIS — N951 Menopausal and female climacteric states: Secondary | ICD-10-CM

## 2018-09-11 DIAGNOSIS — G20 Parkinson's disease: Secondary | ICD-10-CM | POA: Diagnosis not present

## 2018-09-11 DIAGNOSIS — Z23 Encounter for immunization: Secondary | ICD-10-CM | POA: Diagnosis not present

## 2018-09-11 DIAGNOSIS — Z124 Encounter for screening for malignant neoplasm of cervix: Secondary | ICD-10-CM | POA: Diagnosis not present

## 2018-09-11 DIAGNOSIS — Z0000 Encounter for general adult medical examination without abnormal findings: Secondary | ICD-10-CM | POA: Diagnosis not present

## 2018-09-11 MED ORDER — ZOSTER VAC RECOMB ADJUVANTED 50 MCG/0.5ML IM SUSR: 0.5000 mL | Freq: Once | INTRAMUSCULAR | 1 refills | Status: AC

## 2018-09-11 NOTE — Progress Notes (Signed)
Patient ID: Nancy Villegas, female    DOB: December 21, 1960  Age: 58 y.o. MRN: [id number]  The patient is here for annual  preventive examination and management of other chronic and acute problems.   The risk factors are reflected in the social history.  The roster of all physicians providing medical care to patient - is listed in the Snapshot section of the chart.  Activities of daily living:  The patient is 100% independent in all ADLs: dressing, toileting, feeding as well as independent mobility  Home safety : The patient has smoke detectors in the home. They wear seatbelts.  There are no firearms at home. There is no violence in the home.   There is no risks for hepatitis, STDs or HIV. There is no   history of blood transfusion. They have no travel history to infectious disease endemic areas of the world.  The patient has seen their dentist in the last six month. They have seen their eye doctor in the last year. They admit to slight hearing difficulty with regard to whispered voices and some television programs.  They have deferred audiologic testing in the last year.  They do not  have excessive sun exposure. Discussed the need for sun protection: hats, long sleeves and use of sunscreen if there is significant sun exposure.   Diet: the importance of a healthy diet is discussed. They do have a healthy diet.  The benefits of regular aerobic exercise were discussed. She walks 4 times per week ,  20 minutes.   Depression screen: there are no signs or vegative symptoms of depression- irritability, change in appetite, anhedonia, sadness/tearfullness.  Cognitive assessment: the patient manages all their financial and personal affairs and is actively engaged. They could relate day,date,year and events; recalled 2/3 objects at 3 minutes; performed clock-face test normally.  The following portions of the patient's history were reviewed and updated as appropriate: allergies, current medications, past  family history, past medical history,  past surgical history, past social history  and problem list.  Visual acuity was not assessed per patient preference since she has regular follow up with her ophthalmologist. Hearing and body mass index were assessed and reviewed.   During the course of the visit the patient was educated and counseled about appropriate screening and preventive services including : fall prevention , diabetes screening, nutrition counseling, colorectal cancer screening, and recommended immunizations.    CC: The primary encounter diagnosis was Cervical cancer screening. Diagnoses of Need for immunization against influenza, Parkinson's disease (HCC), Encounter for preventive health examination, and Menopausal hot flushes were also pertinent to this visit.  1) Diagnosed with Parkinson's Disease by Dr Arbutus Leas,  Diagnosis confirmed with second opinion by [facility] .  She and husband having a hard time accepting the diagnosis .  She is not taking Sinemet    History Kaydra has a past medical history of Heart murmur, History of chicken pox, and Urine, incontinence, stress female.   She has a past surgical history that includes No past surgeries.   Her family history includes COPD in her father; Cancer in her father; Diabetes in her father; Healthy in her brother, child, and sister; Leukemia in her mother; Parkinsonism (age of onset: 74) in her mother; Ulcerative colitis in her mother.She reports that she has never smoked. She has never used smokeless tobacco. She reports current alcohol use. She reports that she does not use drugs.  Outpatient Medications Prior to Visit  Medication Sig Dispense Refill  . estradiol (  ESTRACE) 1 MG tablet TAKE 1 TABLET(1 MG) BY MOUTH DAILY 30 tablet 2  . progesterone (PROMETRIUM) 200 MG capsule TAKE 1 CAPSULE BY MOUTH DAILY. IN THE EVENING FOR 12 DAYS EVERY MONTH 36 capsule 2  . meloxicam (MOBIC) 15 MG tablet Take 1 tablet (15 mg total) by mouth daily.  (Patient not taking: Reported on 09/11/2018) 30 tablet 3  . methylPREDNISolone (MEDROL) 4 MG tablet Take as directed (Patient not taking: Reported on 09/11/2018) 21 tablet 0   No facility-administered medications prior to visit.     Review of Systems   Patient denies headache, fevers, malaise, unintentional weight loss, skin rash, eye pain, sinus congestion and sinus pain, sore throat, dysphagia,  hemoptysis , cough, dyspnea, wheezing, chest pain, palpitations, orthopnea, edema, abdominal pain, nausea, melena, diarrhea, constipation, flank pain, dysuria, hematuria, urinary  Frequency, nocturia, numbness, tingling, seizures,  Focal weakness, Loss of consciousness,  Tremor, insomnia, depression, anxiety, and suicidal ideation.      Objective:  BP 116/70 (BP Location: Right Arm, Patient Position: Sitting, Cuff Size: Normal)   Pulse 72   Temp 97.8 F (36.6 C) (Oral)   Resp 14   Ht 5\' 6"  (1.676 m)   Wt 138 lb 12.8 oz (63 kg)   SpO2 98%   BMI 22.40 kg/m   Physical Exam   General Appearance:    Alert, cooperative, no distress, appears stated age  Head:    Normocephalic, without obvious abnormality, atraumatic  Eyes:    PERRL, conjunctiva/corneas clear, EOM's intact, fundi    benign, both eyes  Ears:    Normal TM's and external ear canals, both ears  Nose:   Nares normal, septum midline, mucosa normal, no drainage    or sinus tenderness  Throat:   Lips, mucosa, and tongue normal; teeth and gums normal  Neck:   Supple, symmetrical, trachea midline, no adenopathy;    thyroid:  no enlargement/tenderness/nodules; no carotid   bruit or JVD  Back:     Symmetric, no curvature, ROM normal, no CVA tenderness  Lungs:     Clear to auscultation bilaterally, respirations unlabored  Chest Wall:    No tenderness or deformity   Heart:    Regular rate and rhythm, S1 and S2 normal, no murmur, rub   or gallop  Breast Exam:    No tenderness, masses, or nipple abnormality  Abdomen:     Soft, non-tender,  bowel sounds active all four quadrants,    no masses, no organomegaly  Genitalia:    Pelvic: cervix normal in appearance, external genitalia normal, no adnexal masses or tenderness, no cervical motion tenderness, rectovaginal septum normal, uterus normal size, shape, and consistency and vagina normal without discharge  Extremities:   Extremities normal, atraumatic, no cyanosis or edema  Pulses:   2+ and symmetric all extremities  Skin:   Skin color, texture, turgor normal, no rashes or lesions  Lymph nodes:   Cervical, supraclavicular, and axillary nodes normal  Neurologic:   CNII-XII intact, normal strength, sensation and reflexes    throughout      Assessment & Plan:   Problem List Items Addressed This Visit    Encounter for preventive health examination    age appropriate education and counseling updated, referrals for preventative services and immunizations addressed, dietary and smoking counseling addressed, most recent labs reviewed.  I have personally reviewed and have noted:  1) the patient's medical and social history 2) The pt's use of alcohol, tobacco, and illicit drugs 3) The patient's current  medications and supplements 4) Functional ability including ADL's, fall risk, home safety risk, hearing and visual impairment 5) Diet and physical activities 6) Evidence for depression or mood disorder 7) The patient's height, weight, and BMI have been recorded in the chart  I have made referrals, and provided counseling and education based on review of the above      Menopausal hot flushes    managed with HRT.  She has no additional risk factors for thrombotic events.       Parkinson's disease (HCC)    Confirmed with second opinion sought by husband .  She has decided not to start Sinemet yet.  Diagnosis, treatment discussed at length        Other Visit Diagnoses    Cervical cancer screening    -  Primary   Relevant Orders   Cytology - PAP( [facility])   Need for  immunization against influenza       Relevant Orders   Flu Vaccine QUAD 36+ mos IM (Completed)      I have discontinued [facility]ell's methylPREDNISolone and meloxicam. I am also having her start on Zoster Vaccine Adjuvanted. Additionally, I am having her maintain her progesterone and estradiol.  Meds ordered this encounter  Medications  . Zoster Vaccine Adjuvanted [facility]) injection    Sig: Inject 0.5 mLs into the muscle once for 1 dose.    Dispense:  1 each    Refill:  1    Medications Discontinued During This Encounter  Medication Reason  . meloxicam (MOBIC) 15 MG tablet Patient has not taken in last 30 days  . methylPREDNISolone (MEDROL) 4 MG tablet Patient has not taken in last 30 days    Follow-up: No follow-ups on file.   Sherlene Shams, MD

## 2018-09-11 NOTE — Patient Instructions (Signed)
Health Maintenance for Postmenopausal Women Menopause is a normal process in which your reproductive ability comes to an end. This process happens gradually over a span of months to years, usually between the ages of 62 and 89. Menopause is complete when you have missed 12 consecutive menstrual periods. It is important to talk with your health care provider about some of the most common conditions that affect postmenopausal women, such as heart disease, cancer, and bone loss (osteoporosis). Adopting a healthy lifestyle and getting preventive care can help to promote your health and wellness. Those actions can also lower your chances of developing some of these common conditions. What should I know about menopause? During menopause, you may experience a number of symptoms, such as:  Moderate-to-severe hot flashes.  Night sweats.  Decrease in sex drive.  Mood swings.  Headaches.  Tiredness.  Irritability.  Memory problems.  Insomnia. Choosing to treat or not to treat menopausal changes is an individual decision that you make with your health care provider. What should I know about hormone replacement therapy and supplements? Hormone therapy products are effective for treating symptoms that are associated with menopause, such as hot flashes and night sweats. Hormone replacement carries certain risks, especially as you become older. If you are thinking about using estrogen or estrogen with progestin treatments, discuss the benefits and risks with your health care provider. What should I know about heart disease and stroke? Heart disease, heart attack, and stroke become more likely as you age. This may be due, in part, to the hormonal changes that your body experiences during menopause. These can affect how your body processes dietary fats, triglycerides, and cholesterol. Heart attack and stroke are both medical emergencies. There are many things that you can do to help prevent heart disease  and stroke:  Have your blood pressure checked at least every 1-2 years. High blood pressure causes heart disease and increases the risk of stroke.  If you are 79-72 years old, ask your health care provider if you should take aspirin to prevent a heart attack or a stroke.  Do not use any tobacco products, including cigarettes, chewing tobacco, or electronic cigarettes. If you need help quitting, ask your health care provider.  It is important to eat a healthy diet and maintain a healthy weight. ? Be sure to include plenty of vegetables, fruits, low-fat dairy products, and lean protein. ? Avoid eating foods that are high in solid fats, added sugars, or salt (sodium).  Get regular exercise. This is one of the most important things that you can do for your health. ? Try to exercise for at least 150 minutes each week. The type of exercise that you do should increase your heart rate and make you sweat. This is known as moderate-intensity exercise. ? Try to do strengthening exercises at least twice each week. Do these in addition to the moderate-intensity exercise.  Know your numbers.Ask your health care provider to check your cholesterol and your blood glucose. Continue to have your blood tested as directed by your health care provider.  What should I know about cancer screening? There are several types of cancer. Take the following steps to reduce your risk and to catch any cancer development as early as possible. Breast Cancer  Practice breast self-awareness. ? This means understanding how your breasts normally appear and feel. ? It also means doing regular breast self-exams. Let your health care provider know about any changes, no matter how small.  If you are 40 or  older, have a clinician do a breast exam (clinical breast exam or CBE) every year. Depending on your age, family history, and medical history, it may be recommended that you also have a yearly breast X-ray (mammogram).  If you  have a family history of breast cancer, talk with your health care provider about genetic screening.  If you are at high risk for breast cancer, talk with your health care provider about having an MRI and a mammogram every year.  Breast cancer (BRCA) gene test is recommended for women who have family members with BRCA-related cancers. Results of the assessment will determine the need for genetic counseling and BRCA1 and for BRCA2 testing. BRCA-related cancers include these types: ? Breast. This occurs in males or females. ? Ovarian. ? Tubal. This may also be called fallopian tube cancer. ? Cancer of the abdominal or pelvic lining (peritoneal cancer). ? Prostate. ? Pancreatic. Cervical, Uterine, and Ovarian Cancer Your health care provider may recommend that you be screened regularly for cancer of the pelvic organs. These include your ovaries, uterus, and vagina. This screening involves a pelvic exam, which includes checking for microscopic changes to the surface of your cervix (Pap test).  For women ages 21-65, health care providers may recommend a pelvic exam and a Pap test every three years. For women ages 39-65, they may recommend the Pap test and pelvic exam, combined with testing for human papilloma virus (HPV), every five years. Some types of HPV increase your risk of cervical cancer. Testing for HPV may also be done on women of any age who have unclear Pap test results.  Other health care providers may not recommend any screening for nonpregnant women who are considered low risk for pelvic cancer and have no symptoms. Ask your health care provider if a screening pelvic exam is right for you.  If you have had past treatment for cervical cancer or a condition that could lead to cancer, you need Pap tests and screening for cancer for at least 20 years after your treatment. If Pap tests have been discontinued for you, your risk factors (such as having a new sexual partner) need to be reassessed  to determine if you should start having screenings again. Some women have medical problems that increase the chance of getting cervical cancer. In these cases, your health care provider may recommend that you have screening and Pap tests more often.  If you have a family history of uterine cancer or ovarian cancer, talk with your health care provider about genetic screening.  If you have vaginal bleeding after reaching menopause, tell your health care provider.  There are currently no reliable tests available to screen for ovarian cancer. Lung Cancer Lung cancer screening is recommended for adults 57-50 years old who are at high risk for lung cancer because of a history of smoking. A yearly low-dose CT scan of the lungs is recommended if you:  Currently smoke.  Have a history of at least 30 pack-years of smoking and you currently smoke or have quit within the past 15 years. A pack-year is smoking an average of one pack of cigarettes per day for one year. Yearly screening should:  Continue until it has been 15 years since you quit.  Stop if you develop a health problem that would prevent you from having lung cancer treatment. Colorectal Cancer  This type of cancer can be detected and can often be prevented.  Routine colorectal cancer screening usually begins at age 12 and continues through  age 75.  If you have risk factors for colon cancer, your health care provider may recommend that you be screened at an earlier age.  If you have a family history of colorectal cancer, talk with your health care provider about genetic screening.  Your health care provider may also recommend using home test kits to check for hidden blood in your stool.  A small camera at the end of a tube can be used to examine your colon directly (sigmoidoscopy or colonoscopy). This is done to check for the earliest forms of colorectal cancer.  Direct examination of the colon should be repeated every 5-10 years until  age 75. However, if early forms of precancerous polyps or small growths are found or if you have a family history or genetic risk for colorectal cancer, you may need to be screened more often. Skin Cancer  Check your skin from head to toe regularly.  Monitor any moles. Be sure to tell your health care provider: ? About any new moles or changes in moles, especially if there is a change in a mole's shape or color. ? If you have a mole that is larger than the size of a pencil eraser.  If any of your family members has a history of skin cancer, especially at a young age, talk with your health care provider about genetic screening.  Always use sunscreen. Apply sunscreen liberally and repeatedly throughout the day.  Whenever you are outside, protect yourself by wearing long sleeves, pants, a wide-brimmed hat, and sunglasses. What should I know about osteoporosis? Osteoporosis is a condition in which bone destruction happens more quickly than new bone creation. After menopause, you may be at an increased risk for osteoporosis. To help prevent osteoporosis or the bone fractures that can happen because of osteoporosis, the following is recommended:  If you are 19-50 years old, get at least 1,000 mg of calcium and at least 600 mg of vitamin D per day.  If you are older than age 50 but younger than age 70, get at least 1,200 mg of calcium and at least 600 mg of vitamin D per day.  If you are older than age 70, get at least 1,200 mg of calcium and at least 800 mg of vitamin D per day. Smoking and excessive alcohol intake increase the risk of osteoporosis. Eat foods that are rich in calcium and vitamin D, and do weight-bearing exercises several times each week as directed by your health care provider. What should I know about how menopause affects my mental health? Depression may occur at any age, but it is more common as you become older. Common symptoms of depression include:  Low or sad  mood.  Changes in sleep patterns.  Changes in appetite or eating patterns.  Feeling an overall lack of motivation or enjoyment of activities that you previously enjoyed.  Frequent crying spells. Talk with your health care provider if you think that you are experiencing depression. What should I know about immunizations? It is important that you get and maintain your immunizations. These include:  Tetanus, diphtheria, and pertussis (Tdap) booster vaccine.  Influenza every year before the flu season begins.  Pneumonia vaccine.  Shingles vaccine. Your health care provider may also recommend other immunizations. This information is not intended to replace advice given to you by your health care provider. Make sure you discuss any questions you have with your health care provider. Document Released: 10/15/2005 Document Revised: 03/12/2016 Document Reviewed: 05/27/2015 Elsevier Interactive Patient Education    2019 [place].

## 2018-09-12 NOTE — Assessment & Plan Note (Signed)

## 2018-09-12 NOTE — Assessment & Plan Note (Signed)
Confirmed with second opinion sought by husband .  She has decided not to start Sinemet yet.  Diagnosis, treatment discussed at length

## 2018-09-12 NOTE — Assessment & Plan Note (Signed)
managed with HRT.  She has no additional risk factors for thrombotic events.

## 2018-09-13 LAB — CYTOLOGY - PAP
DIAGNOSIS: NEGATIVE
HPV: NOT DETECTED

## 2018-09-19 NOTE — Progress Notes (Deleted)
Nancy Villegas was seen today in the movement disorders clinic for f/u of newly dx PD.  She was started on pramipexole last visit, but she ultimately opted not to take it and she is on no medication.  She reports that ***.  She denies sleep attacks.  She denies compulsive behaviors.  She denies falls.  She denies lightheadedness or near syncope.  She is currently drinking about ***.  Records are reviewed since our last visit.  The patient went to [facility] for a second opinion.  She saw Dr. Lorin PicketScott on June 08, 2017.  By the time that she had seen him, she only took 1 single pill of the 0.125 mg of pramipexole and never had taken another dose.  She reported that it did not help.  Dr. Lorin PicketScott agreed with the diagnosis of early Parkinson's disease.  He recommended amantadine or pramipexole.  He told her to follow-up in 4 months with his PA and in 1 year with him.  PREVIOUS MEDICATIONS: none to date  ALLERGIES:  No Known Allergies  CURRENT MEDICATIONS:  Outpatient Encounter Medications as of 09/21/2018  Medication Sig  . estradiol (ESTRACE) 1 MG tablet TAKE 1 TABLET(1 MG) BY MOUTH DAILY  . progesterone (PROMETRIUM) 200 MG capsule TAKE 1 CAPSULE BY MOUTH DAILY. IN THE EVENING FOR 12 DAYS EVERY MONTH   No facility-administered encounter medications on file as of 09/21/2018.     PAST MEDICAL HISTORY:   Past Medical History:  Diagnosis Date  . Heart murmur   . History of chicken pox   . Urine, incontinence, stress female    leaking with laughter or very full bladder    PAST SURGICAL HISTORY:   Past Surgical History:  Procedure Laterality Date  . NO PAST SURGERIES      SOCIAL HISTORY:   Social History   Socioeconomic History  . Marital status: Married    Spouse name: Not on file  . Number of children: Not on file  . Years of education: Not on file  . Highest education level: Not on file  Occupational History  . Occupation: unemployed    Comment: used to work at [employer] x 15 years  Social  Needs  . Financial resource strain: Not on file  . Food insecurity:    Worry: Not on file    Inability: Not on file  . Transportation needs:    Medical: Not on file    Non-medical: Not on file  Tobacco Use  . Smoking status: Never Smoker  . Smokeless tobacco: Never Used  Substance and Sexual Activity  . Alcohol use: Yes    Comment: 1 drink daily/ more on weekends 05/09/2018  . Drug use: No  . Sexual activity: Yes  Lifestyle  . Physical activity:    Days per week: Not on file    Minutes per session: Not on file  . Stress: Not on file  Relationships  . Social connections:    Talks on phone: Not on file    Gets together: Not on file    Attends religious service: Not on file    Active member of club or organization: Not on file    Attends meetings of clubs or organizations: Not on file    Relationship status: Not on file  . Intimate partner violence:    Fear of current or ex partner: Not on file    Emotionally abused: Not on file    Physically abused: Not on file    Forced  sexual activity: Not on file  Other Topics Concern  . Not on file  Social History Narrative  . Not on file    FAMILY HISTORY:   Family Status  Relation Name Status  . Mother  Deceased  . Father  Deceased  . Sister  Alive  . Brother  Alive  . Child x3 Alive    ROS:  ROS  PHYSICAL EXAMINATION:    VITALS:  There were no vitals filed for this visit.  GEN:  The patient appears stated age and is in NAD. HEENT:  Normocephalic, atraumatic.  The mucous membranes are moist. The superficial temporal arteries are without ropiness or tenderness. CV:  RRR Lungs:  CTAB Neck/HEME:  There are no carotid bruits bilaterally.  Neurological examination:  Orientation: The patient is alert and oriented x3. Fund of knowledge is appropriate.  Recent and remote memory are intact.  Attention and concentration are normal.    Able to name objects and repeat phrases. Cranial nerves: There is good facial symmetry.  Pupils are equal round and reactive to light bilaterally. Fundoscopic exam reveals clear margins bilaterally. Extraocular muscles are intact. The visual fields are full to confrontational testing. The speech is fluent and clear. Soft palate rises symmetrically and there is no tongue deviation. Hearing is intact to conversational tone. Sensation: Sensation is intact to light and pinprick throughout (facial, trunk, extremities). Vibration is intact at the bilateral big toe. There is no extinction with double simultaneous stimulation. There is no sensory dermatomal level identified. Motor: Strength is 5/5 in the bilateral upper and lower extremities.   Shoulder shrug is equal and symmetric.  There is no pronator drift. Deep tendon reflexes: Deep tendon reflexes are 2/4 at the bilateral biceps, triceps, brachioradialis, patella and achilles. Plantar responses are downgoing bilaterally.  Movement examination: Tone: There is mod increased tone in the LUE/LLE Abnormal movements: none even with distraction Coordination:  There is  decremation with RAM's, with any form of RAMS, including alternating supination and pronation of the forearm, hand opening and closing, finger taps, heel taps and toe taps on the L, more in the arms than legs.  RAMs on the right are normal Gait and Station: The patient has no difficulty arising out of a deep-seated chair without the use of the hands. The patient's stride length is good with decreased arm swing on the L.  The patient has a neg pull test.      Labs: As above, the patient had extensive laboratory testing in October 2017 with Dr. Sherryll BurgerShah.  He had a normal TSH.  B12 was 560.  Sedimentation rate was 35.  ANA was negative.  Heavy metal profile was negative.  Lyme was negative.  ACE was normal.  There was no M spike in the protein electrophoresis.  Lab Results  Component Value Date   VITAMINB12 787 08/02/2018   Lab Results  Component Value Date   WBC 5.9 08/02/2018   HGB  12.4 08/02/2018   HCT 36.0 08/02/2018   MCV 97.4 08/02/2018   PLT 239.0 08/02/2018     ASSESSMENT/PLAN:  1.  Parkinsonism.  I suspect that this does represent idiopathic Parkinson's disease.    -We discussed the diagnosis as well as pathophysiology of the disease.  We discussed treatment options as well as prognostic indicators.  Patient education was provided.  -We discussed that it used to be thought that levodopa would increase risk of melanoma but now it is believed that Parkinsons itself likely increases risk of melanoma.  she is to get regular skin checks.  -We decided to add pramipexole and work to 0.5 mg tid.  Risks, benefits, side effects and alternative therapies were discussed.  The opportunity to ask questions was given and they were answered to the best of my ability.  The patient expressed understanding and willingness to follow the outlined treatment protocols.   2.  b12 deficiency  -When she saw Dr. Sherryll Burger in 2017, she was not deficient but she is now.  Discussed that we would like to see that number over 400 and hers is 262.  Recommended over-the-counter B12 supplement, 1000 mcg daily.  3. EtOH use (not abuse)  -does drink EtOH daily.  Consider tapering due to need for liver to process meds daily.  4.  ***  Cc:  Sherlene Shams, MD

## 2018-09-20 ENCOUNTER — Ambulatory Visit
Admission: RE | Admit: 2018-09-20 | Discharge: 2018-09-20 | Disposition: A | Discharge status: Home / Self Care | Payer: BLUE CROSS/BLUE SHIELD | Source: Ambulatory Visit | Attending: Internal Medicine | Admitting: Internal Medicine

## 2018-09-20 DIAGNOSIS — Z1231 Encounter for screening mammogram for malignant neoplasm of breast: Secondary | ICD-10-CM | POA: Diagnosis not present

## 2018-09-21 ENCOUNTER — Ambulatory Visit: Payer: BLUE CROSS/BLUE SHIELD | Admitting: Neurology

## 2018-10-10 DIAGNOSIS — M25552 Pain in left hip: Secondary | ICD-10-CM | POA: Diagnosis not present

## 2018-10-10 DIAGNOSIS — M7062 Trochanteric bursitis, left hip: Secondary | ICD-10-CM | POA: Diagnosis not present

## 2018-10-10 DIAGNOSIS — M545 Low back pain: Secondary | ICD-10-CM | POA: Diagnosis not present

## 2018-10-13 DIAGNOSIS — M25552 Pain in left hip: Secondary | ICD-10-CM | POA: Diagnosis not present

## 2018-10-13 DIAGNOSIS — M7062 Trochanteric bursitis, left hip: Secondary | ICD-10-CM | POA: Diagnosis not present

## 2018-10-13 DIAGNOSIS — M545 Low back pain: Secondary | ICD-10-CM | POA: Diagnosis not present

## 2018-10-16 DIAGNOSIS — M7062 Trochanteric bursitis, left hip: Secondary | ICD-10-CM | POA: Diagnosis not present

## 2018-10-16 DIAGNOSIS — M545 Low back pain: Secondary | ICD-10-CM | POA: Diagnosis not present

## 2018-10-16 DIAGNOSIS — M25552 Pain in left hip: Secondary | ICD-10-CM | POA: Diagnosis not present

## 2018-10-18 DIAGNOSIS — M25552 Pain in left hip: Secondary | ICD-10-CM | POA: Diagnosis not present

## 2018-10-18 DIAGNOSIS — M7062 Trochanteric bursitis, left hip: Secondary | ICD-10-CM | POA: Diagnosis not present

## 2018-10-18 DIAGNOSIS — M545 Low back pain: Secondary | ICD-10-CM | POA: Diagnosis not present

## 2018-10-30 DIAGNOSIS — M7062 Trochanteric bursitis, left hip: Secondary | ICD-10-CM | POA: Diagnosis not present

## 2018-10-30 DIAGNOSIS — M545 Low back pain: Secondary | ICD-10-CM | POA: Diagnosis not present

## 2018-10-30 DIAGNOSIS — M25552 Pain in left hip: Secondary | ICD-10-CM | POA: Diagnosis not present

## 2018-11-01 DIAGNOSIS — M7062 Trochanteric bursitis, left hip: Secondary | ICD-10-CM | POA: Diagnosis not present

## 2018-11-01 DIAGNOSIS — M545 Low back pain: Secondary | ICD-10-CM | POA: Diagnosis not present

## 2018-11-01 DIAGNOSIS — M25552 Pain in left hip: Secondary | ICD-10-CM | POA: Diagnosis not present

## 2018-11-06 DIAGNOSIS — M25552 Pain in left hip: Secondary | ICD-10-CM | POA: Diagnosis not present

## 2018-11-06 DIAGNOSIS — M7062 Trochanteric bursitis, left hip: Secondary | ICD-10-CM | POA: Diagnosis not present

## 2018-11-06 DIAGNOSIS — M545 Low back pain: Secondary | ICD-10-CM | POA: Diagnosis not present

## 2019-01-04 ENCOUNTER — Other Ambulatory Visit: Payer: Self-pay

## 2019-01-04 MED ORDER — ESTRADIOL 1 MG PO TABS: ORAL_TABLET | ORAL | 2 refills | Status: DC

## 2019-01-10 DIAGNOSIS — M71341 Other bursal cyst, right hand: Secondary | ICD-10-CM | POA: Diagnosis not present

## 2019-01-10 DIAGNOSIS — L84 Corns and callosities: Secondary | ICD-10-CM | POA: Diagnosis not present

## 2019-01-30 ENCOUNTER — Other Ambulatory Visit: Payer: Self-pay

## 2019-01-30 ENCOUNTER — Encounter: Payer: Self-pay | Admitting: Internal Medicine

## 2019-01-30 ENCOUNTER — Ambulatory Visit: Payer: Self-pay

## 2019-01-30 ENCOUNTER — Ambulatory Visit (INDEPENDENT_AMBULATORY_CARE_PROVIDER_SITE_OTHER): Payer: BLUE CROSS/BLUE SHIELD | Admitting: Internal Medicine

## 2019-01-30 DIAGNOSIS — B029 Zoster without complications: Secondary | ICD-10-CM | POA: Diagnosis not present

## 2019-01-30 MED ORDER — FAMCICLOVIR 500 MG PO TABS: 500.0000 mg | ORAL_TABLET | Freq: Three times a day (TID) | ORAL | 0 refills | Status: DC

## 2019-01-30 MED ORDER — GABAPENTIN 100 MG PO CAPS: 100.0000 mg | ORAL_CAPSULE | Freq: Three times a day (TID) | ORAL | 3 refills | Status: DC

## 2019-01-30 NOTE — Patient Instructions (Addendum)
I am treating you for shingles  Start the famciclovir tonight,  Every 8 hours for one week    I am prescribing gabapentin to help with your pain.  You can start with 100 mg three times daily and  increase the dose at bedtime up to 300 mg gradually  If needed.   If you tolerate this medication and find that it helps your pain , wan increase the dose gradually as needed , up to 2400 mg daily    The most common side effects are fluid retention and dizziness.    You can add  2000 mg of acetominophen (tylenol) every day safely  In divided doses (500 mg every 6 hours  Or 1000 mg every 12 hours.)  I will prescribe Vicodin tomorrow if your pain is not controlled on the gabapentin and tylenol.   You should avoid pregnant women and people taking chemotherapy for cancer until your rash has resolved

## 2019-01-30 NOTE — Telephone Encounter (Signed)
Pt called stating that she has a rash that is very painful.  She has shown it to a dermatologist who wanted her to contact her PCP.  She states that rash started 1 week ago. It goes from her rt chest to her back near her arm pit.  She describes it as red prickly dry. She denies itching and rates the pain at 6-7. Care advice read to patient. Pt verbalized understanding. Call transferred to [facility] for appointment scheduling.  Reason for Disposition . [1] Shingles rash AND [2] onset > 72 hours ago  Answer Assessment - Initial Assessment Questions 1. APPEARANCE of RASH: "Describe the rash."      Tingles pins burns 2. LOCATION: "Where is the rash located?"      Rt chest shoulder and back armpit area 3. ONSET: "When did the rash start?"      1 week 4. ITCHING: "Does the rash itch?" If so, ask: "How bad is the itch?"  (Scale 1-10; or mild, moderate, severe)    no 5. PAIN: "Does the rash hurt?" If so, ask: "How bad is the pain?"  (Scale 1-10; or mild, moderate, severe)     6-7 mostly at night 6. OTHER SYMPTOMS: "Do you have any other symptoms?" (e.g., fever)     no 7. PREGNANCY: "Is there any chance you are pregnant?" "When was your last menstrual period?"    No husband vascectomy  Protocols used: [facility]

## 2019-01-30 NOTE — Progress Notes (Signed)
Virtual Visit converted to telephone  Note  This visit type was conducted due to national recommendations for restrictions regarding the COVID-19 pandemic (e.g. social distancing).  This format is felt to be most appropriate for this patient at this time.  All issues noted in this document were discussed and addressed.  No physical exam was performed (except for noted visual exam findings with Video Visits).   I connected with@ on 01/30/19 at  3:30 PM EDT by a video enabled telemedicine application or telephone and verified that I am speaking with the correct person using two identifiers. Location patient: home Location provider: work or home office Persons participating in the virtual visit: patient, provider  I discussed the limitations, risks, security and privacy concerns of performing an evaluation and management service by telephone and the availability of in person appointments. I also discussed with the patient that there may be a patient responsible charge related to this service. The patient expressed understanding and agreed to proceed.  Reason for visit: blistering rash on right side of body  Involving shoulders  .  Became very painful 2 nights ago,  Describes pain as burning and stinging.  Has tried ibuprofen and tylenol without improvement. Photographs of patient's rash received vit mychart and reviewed   HPI:  One week history of blistering rash.  Started on right shoulder an old  Now painful    ROS: See pertinent positives and negatives per HPI.  Past Medical History:  Diagnosis Date  . Heart murmur   . History of chicken pox   . Urine, incontinence, stress female    leaking with laughter or very full bladder    Past Surgical History:  Procedure Laterality Date  . NO PAST SURGERIES      Family History  Problem Relation Age of Onset  . Ulcerative colitis Mother   . Leukemia Mother   . Parkinsonism Mother [id number]       drug induced AML  . COPD Father   . Diabetes  Father   . Cancer Father        Renal Cell CA, Lung CA  . Healthy Sister   . Healthy Brother   . Healthy Child   . Breast cancer Neg Hx     SOCIAL HX:  reports that she has never smoked. She has never used smokeless tobacco. She reports current alcohol use. She reports that she does not use drugs.   Current Outpatient Medications:  .  estradiol (ESTRACE) 1 MG tablet, TAKE 1 TABLET(1 MG) BY MOUTH DAILY, Disp: 30 tablet, Rfl: 2 .  progesterone (PROMETRIUM) 200 MG capsule, TAKE 1 CAPSULE BY MOUTH DAILY. IN THE EVENING FOR 12 DAYS EVERY MONTH, Disp: 36 capsule, Rfl: 2 .  famciclovir (FAMVIR) 500 MG tablet, Take 1 tablet (500 mg total) by mouth 3 (three) times daily., Disp: 21 tablet, Rfl: 0 .  gabapentin (NEURONTIN) 100 MG capsule, Take 1 capsule (100 mg total) by mouth 3 (three) times daily., Disp: 90 capsule, Rfl: 3  EXAM:  VITALS per patient if applicable:  GENERAL: alert, oriented, appears well and in no acute distress  HEENT: atraumatic, conjunttiva clear, no obvious abnormalities on inspection of external nose and ears  NECK: normal movements of the head and neck  LUNGS: on inspection no signs of respiratory distress, breathing rate appears normal, no obvious gross SOB, gasping or wheezing  CV: no obvious cyanosis  MS: moves all visible extremities without noticeable abnormality Skin: blistering vesicular rash on right shoulder and back.  PSYCH/NEURO: pleasant and cooperative, no obvious depression or anxiety, speech and thought processing grossly intact  ASSESSMENT AND PLAN:  Discussed the following assessment and plan:  Herpes zoster without complication  Shingles rash Famciclovir, gabapentin and tylenol recommended .  Cautioned to avoid contact with pregnant and immunocompromised persons until the rash resolves.     I discussed the assessment and treatment plan with the patient. The patient was provided an opportunity to ask questions and all were answered. The  patient agreed with the plan and demonstrated an understanding of the instructions.   The patient was advised to call back or seek an in-person evaluation if the symptoms worsen or if the condition fails to improve as anticipated.  I provided 25 minutes of non-face-to-face time during this encounter.   Sherlene Shams, MD

## 2019-01-30 NOTE — Telephone Encounter (Signed)
Has appointment with you today

## 2019-01-30 NOTE — Assessment & Plan Note (Addendum)
Famciclovir, gabapentin and tylenol recommended .  Cautioned to avoid contact with pregnant and immunocompromised persons until the rash resolves.

## 2019-03-15 ENCOUNTER — Other Ambulatory Visit: Payer: Self-pay

## 2019-03-15 ENCOUNTER — Encounter: Payer: Self-pay | Admitting: Podiatry

## 2019-03-15 ENCOUNTER — Ambulatory Visit (INDEPENDENT_AMBULATORY_CARE_PROVIDER_SITE_OTHER): Payer: BC Managed Care – PPO | Admitting: Podiatry

## 2019-03-15 ENCOUNTER — Ambulatory Visit (INDEPENDENT_AMBULATORY_CARE_PROVIDER_SITE_OTHER): Payer: BC Managed Care – PPO

## 2019-03-15 VITALS — Temp 97.9°F

## 2019-03-15 DIAGNOSIS — S93402A Sprain of unspecified ligament of left ankle, initial encounter: Secondary | ICD-10-CM

## 2019-03-15 DIAGNOSIS — S93492A Sprain of other ligament of left ankle, initial encounter: Secondary | ICD-10-CM | POA: Diagnosis not present

## 2019-03-15 DIAGNOSIS — S93409A Sprain of unspecified ligament of unspecified ankle, initial encounter: Secondary | ICD-10-CM | POA: Insufficient documentation

## 2019-03-15 NOTE — Progress Notes (Signed)
This patient presents the office with chief complaint of a painful left ankle.  She states that she twisted her ankle on stairs 1 week ago.  She states that there was severe swelling and pain noted to the ankle following her injury.  She shows me a picture of her left ankle following the injury which has severe swelling noted.  She made an appointment for this office earlier this week and her swelling and pain dramatically diminished.  She even considered not coming in for the appointment.  She presents the office today stating that she has no swelling and only pain noted on the front of her left ankle.  She presents the office today for an evaluation and treatment.  Vascular  Dorsalis pedis and posterior tibial pulses are palpable  B/L.  Capillary return  WNL.  Temperature gradient is  WNL.  Skin turgor  WNL  Sensorium  Senn Weinstein monofilament wire  WNL. Normal tactile sensation.  Nail Exam  Patient has normal nails with no evidence of bacterial or fungal infection.  Orthopedic  Exam  Muscle tone and muscle strength  WNL.  No limitations of motion feet  B/L.  No crepitus or joint effusion noted.  Foot type is unremarkable and digits show no abnormalities.  Bony prominences are unremarkable.  Patient has increased temperature over the anterior aspect of the left ankle with minimal pain.  Patient has no evidence of any swelling on the medial lateral left ankle.  No pain along the course of the peroneal tendon left ankle.  Mild palpable pain noted at the sinus tarsi left ankle.   Skin  No open lesions.  Normal skin texture and turgor.  Left Ankle Sprain.   ROV.  X-rays reveal no evidence of any bony pathology.  Based on my clinical findings as well as evaluating her previous x-ray this patient is healing.  I recommended that she wear a compression sock on her left ankle for stability and for swelling control.  Patient to return to the office if this problem persists.   Gardiner Barefoot DPM

## 2019-04-11 ENCOUNTER — Telehealth: Payer: Self-pay | Admitting: Internal Medicine

## 2019-04-11 MED ORDER — ESTRADIOL 1 MG PO TABS: ORAL_TABLET | ORAL | 1 refills | Status: DC

## 2019-04-11 NOTE — Telephone Encounter (Signed)
Medication has been refilled and pt has been notified.  

## 2019-04-11 NOTE — Telephone Encounter (Signed)
Medication Refill - Medication: estradiol (ESTRACE) 1 MG tablet/ Pt stated she contacted pharmacy twice about a refill and they told her they are waiting on Dr. Derrel Nip to respond. Pt will be out of medication tomorrow and going out of town. She would like to see if refill can be sent in today. Please advise.  Has the patient contacted their pharmacy? Yes.   (Agent: If no, request that the patient contact the pharmacy for the refill.) (Agent: If yes, when and what did the pharmacy advise?)  Preferred Pharmacy (with phone number or street name):  [id number] [address], [place] [phone] (Phone) [phone] (Fax)     Agent: Please be advised that RX refills may take up to 3 business days. We ask that you follow-up with your pharmacy.

## 2019-05-07 ENCOUNTER — Telehealth: Payer: Self-pay

## 2019-05-07 MED ORDER — FLUCONAZOLE 150 MG PO TABS: 150.0000 mg | ORAL_TABLET | Freq: Every day | ORAL | 0 refills | Status: DC

## 2019-05-07 NOTE — Addendum Note (Signed)
Addended by: Crecencio Mc on: 05/07/2019 05:21 PM   Modules accepted: Orders

## 2019-05-07 NOTE — Telephone Encounter (Signed)
Copied from [place] [id number]. Topic: Quick Communication - See Telephone Encounter >> May 07, 2019  3:44 PM Loma Boston wrote: CRM for notification. See Telephone encounter for: 05/07/19.pt sent MyChart message Saturday, has a yeast infection and wants someone [place] or nurse to call in the pill she normally takes for that [place]. She will make an appt if you want her to but pls touch base be very uncomfortable. [phone]

## 2019-05-10 ENCOUNTER — Other Ambulatory Visit: Payer: Self-pay

## 2019-05-10 MED ORDER — PROGESTERONE MICRONIZED 200 MG PO CAPS: ORAL_CAPSULE | ORAL | 2 refills | Status: DC

## 2019-06-18 ENCOUNTER — Ambulatory Visit (INDEPENDENT_AMBULATORY_CARE_PROVIDER_SITE_OTHER): Payer: BC Managed Care – PPO

## 2019-06-18 ENCOUNTER — Encounter: Payer: Self-pay | Admitting: Podiatry

## 2019-06-18 ENCOUNTER — Other Ambulatory Visit: Payer: Self-pay

## 2019-06-18 ENCOUNTER — Ambulatory Visit (INDEPENDENT_AMBULATORY_CARE_PROVIDER_SITE_OTHER): Payer: BC Managed Care – PPO | Admitting: Podiatry

## 2019-06-18 DIAGNOSIS — M2012 Hallux valgus (acquired), left foot: Secondary | ICD-10-CM

## 2019-06-18 NOTE — Patient Instructions (Signed)
Pre-Operative Instructions  Congratulations, you have decided to take an important step towards improving your quality of life.  You can be assured that the doctors and staff at [facility] will be with you every step of the way.  Here are some important things you should know:  1. Plan to be at the surgery center/hospital at least 1 (one) hour prior to your scheduled time, unless otherwise directed by the surgical center/hospital staff.  You must have a responsible adult accompany you, remain during the surgery and drive you home.  Make sure you have directions to the surgical center/hospital to ensure you arrive on time. 2. If you are having surgery at [facility] or [facility], you will need a copy of your medical history and physical form from your family physician within one month prior to the date of surgery. We will give you a form for your primary physician to complete.  3. We make every effort to accommodate the date you request for surgery.  However, there are times where surgery dates or times have to be moved.  We will contact you as soon as possible if a change in schedule is required.   4. No aspirin/ibuprofen for one week before surgery.  If you are on aspirin, any non-steroidal anti-inflammatory medications (Mobic, Aleve, Ibuprofen) should not be taken seven (7) days prior to your surgery.  You make take Tylenol for pain prior to surgery.  5. Medications - If you are taking daily heart and blood pressure medications, seizure, reflux, allergy, asthma, anxiety, pain or diabetes medications, make sure you notify the surgery center/hospital before the day of surgery so they can tell you which medications you should take or avoid the day of surgery. 6. No food or drink after midnight the night before surgery unless directed otherwise by surgical center/hospital staff. 7. No alcoholic beverages 24-hours prior to surgery.  No smoking 24-hours prior or 24-hours after  surgery. 8. Wear loose pants or shorts. They should be loose enough to fit over bandages, boots, and casts. 9. Don't wear slip-on shoes. Sneakers are preferred. 10. Bring your boot with you to the surgery center/hospital.  Also bring crutches or a walker if your physician has prescribed it for you.  If you do not have this equipment, it will be provided for you after surgery. 11. If you have not been contacted by the surgery center/hospital by the day before your surgery, call to confirm the date and time of your surgery. 12. Leave-time from work may vary depending on the type of surgery you have.  Appropriate arrangements should be made prior to surgery with your employer. 13. Prescriptions will be provided immediately following surgery by your doctor.  Fill these as soon as possible after surgery and take the medication as directed. Pain medications will not be refilled on weekends and must be approved by the doctor. 14. Remove nail polish on the operative foot and avoid getting pedicures prior to surgery. 15. Wash the night before surgery.  The night before surgery wash the foot and leg well with water and the antibacterial soap provided. Be sure to pay special attention to beneath the toenails and in between the toes.  Wash for at least three (3) minutes. Rinse thoroughly with water and dry well with a towel.  Perform this wash unless told not to do so by your physician.  Enclosed: 1 Ice pack (please put in freezer the night before surgery)   1 Hibiclens skin cleaner     Pre-op instructions  If you have any questions regarding the instructions, please do not hesitate to call our office.  [place]: [address], [place], [place] [address] -- [phone]  [place]: [address] [place], [place] [address] -- [phone]  [place]: [address]  [place], [place] [address] -- [phone]   Website: [email or web address] 

## 2019-06-18 NOTE — Progress Notes (Signed)
She presents today for follow-up and a surgical consult regarding her left foot.  She denies fever chills nausea vomiting muscle aches pain states her left foot is becoming more painful as time goes on.  She states that starting to affect her ability to perform her daily activities.  ROS: Denies fever chills nausea vomiting muscle aches pains calf pain back pain chest pain shortness of breath.  Objective: Vital signs are stable she alert oriented x3 I reviewed her past medical history medications allergies surgeries and social history.  Pulses are strongly palpable.  Neurologic sensorium is intact.  Deep tendon reflexes are intact.  Muscle strength is normal symmetrical bilateral.  Orthopedic evaluation of straits all joints distal to the ankle full range of motion without crepitation.  She increase in the first intermetatarsal angle left foot as opposed to the right with a hallux abductus angle and a cocked up hammertoe deformity second left.  She also has pain on palpation of second metatarsal phalangeal joint and pain on end range of motion of the toe particular plantar flexion.  Radiographs taken today demonstrate an increase in the first intermetatarsal angle greater than normal value greater than 15 degrees.  She is slightly hypermobile on range of motion the hallux abductus angle is greater than normal value.  Cutaneous evaluation demonstrates no open lesions or wounds.  Distal clavus is noted to the fourth toe left foot.  Assessment: Hallux abductus valgus left foot.  Plantarflexed elongated second metatarsal left foot.  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  Plan: Discussed etiology pathology conservative versus surgical therapies at this point consented her for a Lapidus procedure with cast application second tarsal osteotomy with screw fixation and a tenotomy flexor left.  We discussed the possible postop complications which may include but not limited to postop pain  bleeding swelling infection recurrence need for further surgery overcorrection under correction loss of digit loss of limb loss of life.  Divided her with information regarding the surgery center and anesthesia group.  Also provided her with information for the morning of surgery.  I will follow-up with her in the near future for surgical intervention.

## 2019-06-19 ENCOUNTER — Telehealth: Payer: Self-pay | Admitting: *Deleted

## 2019-06-19 NOTE — Telephone Encounter (Signed)
Called pt and left a voicemail letting her know I could get her scheduled for surgery on Friday 20 November or afterwards. Told pt to call me back directly at [phone].

## 2019-06-19 NOTE — Telephone Encounter (Signed)
"  I saw Dr. Milinda Pointer yesterday.  He told me to give you a call to schedule my surgery."   I will send a message to Coralee Pesa.  She'll give you a call back.

## 2019-06-25 NOTE — Telephone Encounter (Addendum)
Called pt back to get her scheduled for surgery. Asked if there was a certain Friday she was looking at. Pt wanted after the first of the year. I scheduled her surgery for 09/14/2019 and told her she could go ahead and register online with [facility]. I told her they could call and get the needed information if she was unable to. I also told her I could not give her a time that they would contact her a day or two prior to let her know what time to arrive.  I will get her consent forms and posting sheet faxed or e-mailed to [facility] at [facility] today.

## 2019-06-25 NOTE — Telephone Encounter (Signed)
I'm calling you back to schedule surgery with Dr. Milinda Pointer. Please call me back.

## 2019-07-05 ENCOUNTER — Other Ambulatory Visit: Payer: Self-pay | Admitting: Internal Medicine

## 2019-07-05 DIAGNOSIS — Z1231 Encounter for screening mammogram for malignant neoplasm of breast: Secondary | ICD-10-CM

## 2019-07-25 DIAGNOSIS — H52223 Regular astigmatism, bilateral: Secondary | ICD-10-CM | POA: Diagnosis not present

## 2019-08-13 ENCOUNTER — Telehealth: Payer: Self-pay | Admitting: Internal Medicine

## 2019-08-13 NOTE — Telephone Encounter (Signed)
LMTCB. Need to find out what symptoms the pt is having, when it started and if she has tried using anything for it.

## 2019-08-13 NOTE — Telephone Encounter (Signed)
Patient called requesting medication regarding a yeast infection.  Pharmacy [facility] [address] AT [address] [id number]  Patient Call back [phone]

## 2019-09-10 ENCOUNTER — Telehealth: Payer: Self-pay | Admitting: Podiatry

## 2019-09-10 NOTE — Telephone Encounter (Signed)
DOS: 09/14/2019  SURGICAL PROCEDURES: Lapidus Procedure Including Bunionectomy MIWO(03212), Metatarsal Osteotomy 2nd YQMG(50037), Tenotomy Single Flexor 4th CWUG(89169), and Cast Application Left  BCBS Policy Effective : 09/06/2018  -  09/05/9998   Member Liability Summary       In-Network   Max Per Benefit Period Year-to-Date Remaining     CoInsurance         Deductible $3,600.00 $3,600.00     Out-Of-Pocket 3 3 Out-of-Pocket includes copay, deductible, and coinsurance.   [facility]      In-Network Copay Coinsurance Authorization Required Not Applicable 0%  See Messages  Benefit:  09/07/2019 - 09/05/9998 Utilization Management Organization: [employer]NAG[employer] Telephone:  [phone]   Per Lyn A no prior authorization or referrals are required. Call ref# [id number].  Per Alli A no pre determination is required. Call ref# [id number].

## 2019-09-13 ENCOUNTER — Other Ambulatory Visit: Payer: Self-pay | Admitting: Podiatry

## 2019-09-13 MED ORDER — ONDANSETRON HCL 4 MG PO TABS: 4.0000 mg | ORAL_TABLET | Freq: Three times a day (TID) | ORAL | 0 refills | Status: DC | PRN

## 2019-09-13 MED ORDER — CEPHALEXIN 500 MG PO CAPS: 500.0000 mg | ORAL_CAPSULE | Freq: Three times a day (TID) | ORAL | 0 refills | Status: DC

## 2019-09-13 MED ORDER — OXYCODONE-ACETAMINOPHEN 10-325 MG PO TABS: 1.0000 | ORAL_TABLET | Freq: Three times a day (TID) | ORAL | 0 refills | Status: AC | PRN

## 2019-09-14 ENCOUNTER — Encounter: Payer: Self-pay | Admitting: Podiatry

## 2019-09-14 DIAGNOSIS — M25572 Pain in left ankle and joints of left foot: Secondary | ICD-10-CM | POA: Diagnosis not present

## 2019-09-14 DIAGNOSIS — M21542 Acquired clubfoot, left foot: Secondary | ICD-10-CM | POA: Diagnosis not present

## 2019-09-14 DIAGNOSIS — G20 Parkinson's disease: Secondary | ICD-10-CM | POA: Diagnosis not present

## 2019-09-14 DIAGNOSIS — M24575 Contracture, left foot: Secondary | ICD-10-CM | POA: Diagnosis not present

## 2019-09-14 DIAGNOSIS — M2012 Hallux valgus (acquired), left foot: Secondary | ICD-10-CM | POA: Diagnosis not present

## 2019-09-14 DIAGNOSIS — M216X2 Other acquired deformities of left foot: Secondary | ICD-10-CM | POA: Diagnosis not present

## 2019-09-14 DIAGNOSIS — M2042 Other hammer toe(s) (acquired), left foot: Secondary | ICD-10-CM | POA: Diagnosis not present

## 2019-09-16 ENCOUNTER — Encounter: Payer: Self-pay | Admitting: Podiatry

## 2019-09-17 NOTE — Telephone Encounter (Signed)
I spoke with pt and asked how she was doing with her pain today. Pt states she has had to take a pain pill yet, feels she may better than she was the 2nd day. I told pt her prescriptions said she could take one every 8 hours so she was taking properly, and if she needed additional pain coverage, and she was able to tolerate ibuproven, she could take 800mg  Ibuprofen every 8 hours in between the dosing of the percocet. Pt asked if she could take extra strength tylenol and I told her no, the percocet contained tylenol. Pt states understanding.

## 2019-09-19 ENCOUNTER — Ambulatory Visit (INDEPENDENT_AMBULATORY_CARE_PROVIDER_SITE_OTHER): Payer: BC Managed Care – PPO | Admitting: Podiatry

## 2019-09-19 ENCOUNTER — Ambulatory Visit (INDEPENDENT_AMBULATORY_CARE_PROVIDER_SITE_OTHER): Payer: BC Managed Care – PPO

## 2019-09-19 ENCOUNTER — Encounter: Payer: Self-pay | Admitting: Podiatry

## 2019-09-19 ENCOUNTER — Other Ambulatory Visit: Payer: Self-pay

## 2019-09-19 VITALS — BP 128/74 | HR 64 | Temp 98.1°F

## 2019-09-19 DIAGNOSIS — M21962 Unspecified acquired deformity of left lower leg: Secondary | ICD-10-CM

## 2019-09-19 DIAGNOSIS — M2012 Hallux valgus (acquired), left foot: Secondary | ICD-10-CM

## 2019-09-19 DIAGNOSIS — Z9889 Other specified postprocedural states: Secondary | ICD-10-CM

## 2019-09-19 NOTE — Progress Notes (Signed)
She presents today for her first postop visit she is status post Lapidus procedure and second metatarsal osteotomy with cyanotic me to the fourth toe.  States that the cast is doing very well she has had some pain after the block wore off but she is doing a lot better.  Objective: Vital signs are stable she is alert and oriented x3.  There is no erythema edema cellulitis drainage or odor visible on the toes.  Cast is intact I see no signs of the cast being wet.  The cast clean and dry plantarly.  Radiographs taken today demonstrate well healing surgical foot internal fixation is in good position and appears to remain adequately tight.  Assessment: Status post Lapidus procedure second met osteotomy flexor tenotomy fourth toe left foot  Plan: We will follow-up with her in 1 week for cast change.  She will continue nonweightbearing.

## 2019-09-26 ENCOUNTER — Other Ambulatory Visit: Payer: Self-pay

## 2019-09-26 ENCOUNTER — Ambulatory Visit (INDEPENDENT_AMBULATORY_CARE_PROVIDER_SITE_OTHER): Payer: BC Managed Care – PPO | Admitting: Podiatry

## 2019-09-26 DIAGNOSIS — M2012 Hallux valgus (acquired), left foot: Secondary | ICD-10-CM

## 2019-09-26 DIAGNOSIS — Z9889 Other specified postprocedural states: Secondary | ICD-10-CM

## 2019-09-26 DIAGNOSIS — M21962 Unspecified acquired deformity of left lower leg: Secondary | ICD-10-CM

## 2019-09-26 NOTE — Progress Notes (Signed)
She presents today for postop visit date of surgery September 14, 2019 Lapidus procedure a second metatarsal osteotomy with cast application.  States that she has been doing very well.  She denies fever chills nausea vomiting muscle aches pains calf pain back pain chest pain shortness of breath.  Objective: Vital signs are stable alert and oriented x3.  Cast is intact dry and clean.  Was removed demonstrates minimal edema no erythema cellulitis drainage or odor incision site appears to be doing very well.  Margins are well coapted.  Assessment: Well-healing surgical foot.  Plan: Redressed today dressed a compressive dressing encouraged range of motion exercises for the toe.  She understands this is amenable to it recasted the left lower extremity today and I will follow-up with her in 2 weeks for cast removal and boot placement.  We will probably send her to physical therapy to help with the range of motion of the first metatarsophalangeal joint.  It appears that she has been holding the toe up in apposition so we will probably need a little range of motion exercises just to the metatarsal phalangeal joint itself.  We will reevaluate next visit but with her neurological disorder I think muscle strength would also need to be worked on.

## 2019-10-10 ENCOUNTER — Encounter: Payer: Self-pay | Admitting: Podiatry

## 2019-10-10 ENCOUNTER — Ambulatory Visit: Payer: BC Managed Care – PPO

## 2019-10-10 ENCOUNTER — Ambulatory Visit (INDEPENDENT_AMBULATORY_CARE_PROVIDER_SITE_OTHER): Payer: BC Managed Care – PPO | Admitting: Podiatry

## 2019-10-10 ENCOUNTER — Other Ambulatory Visit: Payer: Self-pay

## 2019-10-10 DIAGNOSIS — Z9889 Other specified postprocedural states: Secondary | ICD-10-CM

## 2019-10-10 DIAGNOSIS — M21962 Unspecified acquired deformity of left lower leg: Secondary | ICD-10-CM

## 2019-10-10 DIAGNOSIS — M2012 Hallux valgus (acquired), left foot: Secondary | ICD-10-CM

## 2019-10-10 MED ORDER — ESTRADIOL 1 MG PO TABS: ORAL_TABLET | ORAL | 1 refills | Status: DC

## 2019-10-10 NOTE — Progress Notes (Signed)
She presents today date of surgery September 14, 2019 status post Lapidus procedure left second metatarsal osteotomy left and tenotomy fourth flexor tendon  She presents in her cast today ready for removal.  States that she is been doing well very little pain.  Objective: Vital signs are stable she is alert and oriented x3.  Cast is intact dry and clean removed sutures are intact sutures were removed no erythema there is mild edema no cellulitis drainage or odor she has stiffness of the toes possibly associated with her Parkinson's but nonetheless the edema is also contributory.  She has good range of motion otherwise no signs of infection.  Assessment: Well-healing surgical foot left.  Plan: Allow her to start getting this wet massaging and putting lotion on it making it feel better.  I will also allow her to utilize a compression anklet and a cam walker but she will be nonweightbearing with this cam walker.  I will follow-up with her in 2 weeks at which time we hope to start weightbearing and x-rays will be taken at that time since they were not taken today.

## 2019-10-24 ENCOUNTER — Other Ambulatory Visit: Payer: Self-pay

## 2019-10-24 ENCOUNTER — Ambulatory Visit (INDEPENDENT_AMBULATORY_CARE_PROVIDER_SITE_OTHER): Payer: BC Managed Care – PPO

## 2019-10-24 ENCOUNTER — Ambulatory Visit (INDEPENDENT_AMBULATORY_CARE_PROVIDER_SITE_OTHER): Payer: BC Managed Care – PPO | Admitting: Podiatry

## 2019-10-24 DIAGNOSIS — M21962 Unspecified acquired deformity of left lower leg: Secondary | ICD-10-CM | POA: Diagnosis not present

## 2019-10-24 DIAGNOSIS — M2012 Hallux valgus (acquired), left foot: Secondary | ICD-10-CM

## 2019-10-24 DIAGNOSIS — Z9889 Other specified postprocedural states: Secondary | ICD-10-CM

## 2019-10-24 NOTE — Progress Notes (Signed)
She presents today for fourth postop visit date of surgery January 2021 status post Lapidus procedure including bunionectomy with metatarsal osteotomy second tenotomy fourth toe and cast application.  States that there is some stiffness in the first and second digits but otherwise there is no pain.  Objective: Presents today ambulating with crutches nonweightbearing. Vital signs are stable alert oriented x3 CAM Walker removed demonstrates moderate edema no erythema cellulitis drainage or odor she has tenderness on range of motion of the first metatarsophalangeal joint but appears to be full.  Radiographs do demonstrate well healing osteotomy with internal fixation intact as well as first TMT J fusion which appears to be healing very nicely.  Assessment: Well-healing surgical foot.  Plan: Encouraged her to utilize her cam walker and ambulate normally as possible and I will follow-up with her in about 3 weeks at which time we will be in a regular tennis shoe.

## 2019-11-14 ENCOUNTER — Other Ambulatory Visit: Payer: Self-pay

## 2019-11-14 ENCOUNTER — Ambulatory Visit
Admission: RE | Admit: 2019-11-14 | Discharge: 2019-11-14 | Disposition: A | Discharge status: Home / Self Care | Payer: BC Managed Care – PPO | Source: Ambulatory Visit | Attending: Internal Medicine | Admitting: Internal Medicine

## 2019-11-14 DIAGNOSIS — Z1231 Encounter for screening mammogram for malignant neoplasm of breast: Secondary | ICD-10-CM | POA: Diagnosis not present

## 2019-11-15 ENCOUNTER — Other Ambulatory Visit: Payer: Self-pay

## 2019-11-15 ENCOUNTER — Other Ambulatory Visit: Payer: Self-pay | Admitting: Internal Medicine

## 2019-11-15 DIAGNOSIS — Z23 Encounter for immunization: Secondary | ICD-10-CM | POA: Diagnosis not present

## 2019-11-15 DIAGNOSIS — R928 Other abnormal and inconclusive findings on diagnostic imaging of breast: Secondary | ICD-10-CM

## 2019-11-19 ENCOUNTER — Encounter: Payer: BC Managed Care – PPO | Admitting: Podiatry

## 2019-11-19 ENCOUNTER — Ambulatory Visit (INDEPENDENT_AMBULATORY_CARE_PROVIDER_SITE_OTHER): Payer: BC Managed Care – PPO

## 2019-11-19 ENCOUNTER — Encounter: Payer: Self-pay | Admitting: Internal Medicine

## 2019-11-19 ENCOUNTER — Ambulatory Visit (INDEPENDENT_AMBULATORY_CARE_PROVIDER_SITE_OTHER): Payer: BC Managed Care – PPO | Admitting: Internal Medicine

## 2019-11-19 ENCOUNTER — Other Ambulatory Visit: Payer: Self-pay

## 2019-11-19 ENCOUNTER — Encounter: Payer: Self-pay | Admitting: Podiatry

## 2019-11-19 ENCOUNTER — Ambulatory Visit (INDEPENDENT_AMBULATORY_CARE_PROVIDER_SITE_OTHER): Payer: BC Managed Care – PPO | Admitting: Podiatry

## 2019-11-19 VITALS — BP 128/82 | HR 75 | Temp 97.3°F | Resp 14 | Ht 66.0 in | Wt 142.4 lb

## 2019-11-19 DIAGNOSIS — E538 Deficiency of other specified B group vitamins: Secondary | ICD-10-CM

## 2019-11-19 DIAGNOSIS — E559 Vitamin D deficiency, unspecified: Secondary | ICD-10-CM

## 2019-11-19 DIAGNOSIS — M2012 Hallux valgus (acquired), left foot: Secondary | ICD-10-CM

## 2019-11-19 DIAGNOSIS — N761 Subacute and chronic vaginitis: Secondary | ICD-10-CM

## 2019-11-19 DIAGNOSIS — Z9889 Other specified postprocedural states: Secondary | ICD-10-CM

## 2019-11-19 DIAGNOSIS — M21962 Unspecified acquired deformity of left lower leg: Secondary | ICD-10-CM

## 2019-11-19 DIAGNOSIS — G20 Parkinson's disease: Secondary | ICD-10-CM | POA: Diagnosis not present

## 2019-11-19 DIAGNOSIS — Z0001 Encounter for general adult medical examination with abnormal findings: Secondary | ICD-10-CM

## 2019-11-19 DIAGNOSIS — Z0000 Encounter for general adult medical examination without abnormal findings: Secondary | ICD-10-CM

## 2019-11-19 DIAGNOSIS — R5383 Other fatigue: Secondary | ICD-10-CM | POA: Diagnosis not present

## 2019-11-19 DIAGNOSIS — E785 Hyperlipidemia, unspecified: Secondary | ICD-10-CM

## 2019-11-19 DIAGNOSIS — G20A1 Parkinson's disease without dyskinesia, without mention of fluctuations: Secondary | ICD-10-CM

## 2019-11-19 LAB — VITAMIN D 25 HYDROXY (VIT D DEFICIENCY, FRACTURES): VITD: 49.3 ng/mL (ref 30.00–100.00)

## 2019-11-19 LAB — COMPREHENSIVE METABOLIC PANEL
ALT: 13 U/L (ref 0–35)
AST: 16 U/L (ref 0–37)
Albumin: 4.3 g/dL (ref 3.5–5.2)
Alkaline Phosphatase: 50 U/L (ref 39–117)
BUN: 28 mg/dL — ABNORMAL HIGH (ref 6–23)
CO2: 26 mEq/L (ref 19–32)
Calcium: 9.6 mg/dL (ref 8.4–10.5)
Chloride: 102 mEq/L (ref 96–112)
Creatinine, Ser: 0.53 mg/dL (ref 0.40–1.20)
GFR: 118.54 mL/min (ref >60.00)
Glucose, Bld: 104 mg/dL — ABNORMAL HIGH (ref 70–99)
Potassium: 4 mEq/L (ref 3.5–5.1)
Sodium: 137 mEq/L (ref 135–145)
Total Bilirubin: 0.4 mg/dL (ref 0.2–1.2)
Total Protein: 7.2 g/dL (ref 6.0–8.3)

## 2019-11-19 LAB — LIPID PANEL
Cholesterol: 202 mg/dL — ABNORMAL HIGH (ref 0–200)
HDL: 105.4 mg/dL (ref >39.00)
LDL Cholesterol: 82 mg/dL (ref 0–99)
NonHDL: 96.91
Total CHOL/HDL Ratio: 2
Triglycerides: 77 mg/dL (ref 0.0–149.0)
VLDL: 15.4 mg/dL (ref 0.0–40.0)

## 2019-11-19 LAB — TSH: TSH: 0.82 u[IU]/mL (ref 0.35–4.50)

## 2019-11-19 NOTE — Assessment & Plan Note (Addendum)
Diagnosis confirmed by second opinion (Dr Quinn Plowman,  [facility], 2019)  Advised to RTC 4 months ,  Did not return.  She is apprehensive about resuming mirapex for the "freezing" because of concern that it was amplify the tremor.  Encouraged to consider resuming medication at the lower dose

## 2019-11-19 NOTE — Patient Instructions (Addendum)
Restart the Mirapex at the lower dose 0.125 mg three times daily It can be increased to 2 tablets  (0.25 mg ) three times daily after 2 weeks  The next higher dose would be the 0.5 mg bottle    Referral to Dr Leeroy Bock Ward at [facility] for the vaginitis    Health Maintenance, Female Adopting a healthy lifestyle and getting preventive care are important in promoting health and wellness. Ask your health care provider about:  The right schedule for you to have regular tests and exams.  Things you can do on your own to prevent diseases and keep yourself healthy. What should I know about diet, weight, and exercise? Eat a healthy diet   Eat a diet that includes plenty of vegetables, fruits, low-fat dairy products, and lean protein.  Do not eat a lot of foods that are high in solid fats, added sugars, or sodium. Maintain a healthy weight Body mass index (BMI) is used to identify weight problems. It estimates body fat based on height and weight. Your health care provider can help determine your BMI and help you achieve or maintain a healthy weight. Get regular exercise Get regular exercise. This is one of the most important things you can do for your health. Most adults should:  Exercise for at least 150 minutes each week. The exercise should increase your heart rate and make you sweat (moderate-intensity exercise).  Do strengthening exercises at least twice a week. This is in addition to the moderate-intensity exercise.  Spend less time sitting. Even light physical activity can be beneficial. Watch cholesterol and blood lipids Have your blood tested for lipids and cholesterol at 58 years of age, then have this test every 5 years. Have your cholesterol levels checked more often if:  Your lipid or cholesterol levels are high.  You are older than 58 years of age.  You are at high risk for heart disease. What should I know about cancer screening? Depending on your health history and  family history, you may need to have cancer screening at various ages. This may include screening for:  Breast cancer.  Cervical cancer.  Colorectal cancer.  Skin cancer.  Lung cancer. What should I know about heart disease, diabetes, and high blood pressure? Blood pressure and heart disease  High blood pressure causes heart disease and increases the risk of stroke. This is more likely to develop in people who have high blood pressure readings, are of African descent, or are overweight.  Have your blood pressure checked: ? Every 3-5 years if you are 42-79 years of age. ? Every year if you are 59 years old or older. Diabetes Have regular diabetes screenings. This checks your fasting blood sugar level. Have the screening done:  Once every three years after age 78 if you are at a normal weight and have a low risk for diabetes.  More often and at a younger age if you are overweight or have a high risk for diabetes. What should I know about preventing infection? Hepatitis B If you have a higher risk for hepatitis B, you should be screened for this virus. Talk with your health care provider to find out if you are at risk for hepatitis B infection. Hepatitis C Testing is recommended for:  Everyone born from [birth year] through 1964.  Anyone with known risk factors for hepatitis C. Sexually transmitted infections (STIs)  Get screened for STIs, including gonorrhea and chlamydia, if: ? You are sexually active and are younger than 58  years of age. ? You are older than 58 years of age and your health care provider tells you that you are at risk for this type of infection. ? Your sexual activity has changed since you were last screened, and you are at increased risk for chlamydia or gonorrhea. Ask your health care provider if you are at risk.  Ask your health care provider about whether you are at high risk for HIV. Your health care provider may recommend a prescription medicine to help prevent  HIV infection. If you choose to take medicine to prevent HIV, you should first get tested for HIV. You should then be tested every 3 months for as long as you are taking the medicine. Pregnancy  If you are about to stop having your period (premenopausal) and you may become pregnant, seek counseling before you get pregnant.  Take 400 to 800 micrograms (mcg) of folic acid every day if you become pregnant.  Ask for birth control (contraception) if you want to prevent pregnancy. Osteoporosis and menopause Osteoporosis is a disease in which the bones lose minerals and strength with aging. This can result in bone fractures. If you are 81 years old or older, or if you are at risk for osteoporosis and fractures, ask your health care provider if you should:  Be screened for bone loss.  Take a calcium or vitamin D supplement to lower your risk of fractures.  Be given hormone replacement therapy (HRT) to treat symptoms of menopause. Follow these instructions at home: Lifestyle  Do not use any products that contain nicotine or tobacco, such as cigarettes, e-cigarettes, and chewing tobacco. If you need help quitting, ask your health care provider.  Do not use street drugs.  Do not share needles.  Ask your health care provider for help if you need support or information about quitting drugs. Alcohol use  Do not drink alcohol if: ? Your health care provider tells you not to drink. ? You are pregnant, may be pregnant, or are planning to become pregnant.  If you drink alcohol: ? Limit how much you use to 0-1 drink a day. ? Limit intake if you are breastfeeding.  Be aware of how much alcohol is in your drink. In the [place] one drink equals one 12 oz bottle of beer (355 mL), one 5 oz glass of wine (148 mL), or one 1 oz glass of hard liquor (44 mL). General instructions  Schedule regular health, dental, and eye exams.  Stay current with your vaccines.  Tell your health care provider if: ? You  often feel depressed. ? You have ever been abused or do not feel safe at home. Summary  Adopting a healthy lifestyle and getting preventive care are important in promoting health and wellness.  Follow your health care provider's instructions about healthy diet, exercising, and getting tested or screened for diseases.  Follow your health care provider's instructions on monitoring your cholesterol and blood pressure. This information is not intended to replace advice given to you by your health care provider. Make sure you discuss any questions you have with your health care provider. Document Revised: 08/16/2018 Document Reviewed: 08/16/2018 Elsevier Patient Education  2020 [employer].

## 2019-11-19 NOTE — Progress Notes (Signed)
She presents today date of surgery September 14, 2019 status post Lapidus procedure second metatarsal osteotomy tenotomy flexor fourth toe she.  She states that is still swelling.  Objective: Vital signs are stable she is alert and oriented x3 presents today still walking in her boot.  There is no erythema cellulitis drainage odor though she does have some swelling about the foot.  At this point she can no longer plantar flex the first metatarsophalangeal joint and she is starting to develop a mild hallux varus and a mild hallux malleus.  This is also visible on radiograph though she is gone on to completely heal her fusion site.  The skin is tight to the medial aspect of the foot.  This is possibly one reason she is getting a pulling over on the hallux.  But disorder baffles me now that she has at this point later in the game developed a malleus and is in a mild varus position.  She also has lost plantarflexion due to what feels like a bony block or a fibrous scar tissue block.  Assessment: Stiff first metatarsophalangeal joint status post Lapidus procedure.  Plan: This point we will send her to physical therapy see if we can get her range of motion back and get the skin loosened up so that he quit pulling the toe off the head of the first metatarsal.

## 2019-11-19 NOTE — Progress Notes (Signed)
Patient ID: Nancy Villegas, female    DOB: 1961/08/16  Age: 59 y.o. MRN: [id number]  The patient is here for annual  wellness examination and management of other chronic and acute problems.  This visit occurred during the SARS-CoV-2 public health emergency.  Safety protocols were in place, including screening questions prior to the visit, additional usage of staff PPE, and extensive cleaning of exam room while observing appropriate contact time as indicated for disinfecting solutions.    The risk factors are reflected in the social history.  The roster of all physicians providing medical care to patient - is listed in the Snapshot section of the chart.  Activities of daily living:  The patient is 100% independent in all ADLs: dressing, toileting, feeding as well as independent mobility  Home safety : The patient has smoke detectors in the home. They wear seatbelts.  There are no firearms at home. There is no violence in the home.   There is no risks for hepatitis, STDs or HIV. There is no   history of blood transfusion. They have no travel history to infectious disease endemic areas of the world.  The patient has seen their dentist in the last six month. They have seen their eye doctor in the last year. They admit to slight hearing difficulty with regard to whispered voices and some television programs.  They have deferred audiologic testing in the last year.  They do not  have excessive sun exposure. Discussed the need for sun protection: hats, long sleeves and use of sunscreen if there is significant sun exposure.   Diet: the importance of a healthy diet is discussed. They do have a healthy diet.  The benefits of regular aerobic exercise were discussed. She walks 4 times per week ,  20 minutes.   Depression screen: there are no signs or vegative symptoms of depression- irritability, change in appetite, anhedonia, sadness/tearfullness.  Cognitive assessment: the patient manages all their  financial and personal affairs and is actively engaged. They could relate day,date,year and events; recalled 2/3 objects at 3 minutes; performed clock-face test normally.  The following portions of the patient's history were reviewed and updated as appropriate: allergies, current medications, past family history, past medical history,  past surgical history, past social history  and problem list.  Visual acuity was not assessed per patient preference since she has regular follow up with her ophthalmologist. Hearing and body mass index were assessed and reviewed.   During the course of the visit the patient was educated and counseled about appropriate screening and preventive services including : fall prevention , diabetes screening, nutrition counseling, colorectal cancer screening, and recommended immunizations.    CC: The primary encounter diagnosis was Encounter for preventive health examination. Diagnoses of Parkinson's disease (HCC), B12 deficiency, Fatigue, unspecified type, Hyperlipidemia LDL goal <160, Vitamin D deficiency, Chronic vaginitis, and S/P bunionectomy were also pertinent to this visit.   1) she is s/p Lapidus procedure with cast application second tarsal osteotomy with screw fixation and a tenotomy flexor left foot  Several months ago.  Foot remains swollen, unable to bend great toe.  [facility])   2) recurrent episodes of vaginal irritation  occurring without  discharge.  Still using oral estrogen /progesterone,  But has developed vaginal dryness requiring lubrication for penetration  and itching that is episodic,  Not sure if it occurs after use of otc lubricants.  Has requested treatment for candidiasis on several occasions. PAP smear normal Jan 2020   3) PD diagnosis  discussed.  She continues to have symptoms confined to the left side of body  Left side stiffness , freezing becoming more noticeable to patient. She has not told anyone but her husband. Her daughter has commented on  her slowness.   Discussed resuming mirapex and returning  to Dr Tat   History Nancy Villegas has a past medical history of Heart murmur, History of chicken pox, and Urine, incontinence, stress female.   She has a past surgical history that includes No past surgeries.   Her family history includes COPD in her father; Cancer in her father; Diabetes in her father; Healthy in her brother, child, and sister; Leukemia in her mother; Parkinsonism (age of onset: 42) in her mother; Ulcerative colitis in her mother.She reports that she has never smoked. She has never used smokeless tobacco. She reports current alcohol use. She reports that she does not use drugs.  Outpatient Medications Prior to Visit  Medication Sig Dispense Refill  . estradiol (ESTRACE) 1 MG tablet TAKE 1 TABLET(1 MG) BY MOUTH DAILY 90 tablet 1  . progesterone (PROMETRIUM) 200 MG capsule TAKE 1 CAPSULE BY MOUTH DAILY. IN THE EVENING FOR 12 DAYS EVERY MONTH 36 capsule 2  . cephALEXin (KEFLEX) 500 MG capsule Take 1 capsule (500 mg total) by mouth 3 (three) times daily. (Patient not taking: Reported on 11/19/2019) 30 capsule 0  . fluconazole (DIFLUCAN) 150 MG tablet Take 1 tablet (150 mg total) by mouth daily. (Patient not taking: Reported on 11/19/2019) 2 tablet 0  . ondansetron (ZOFRAN) 4 MG tablet Take 1 tablet (4 mg total) by mouth every 8 (eight) hours as needed. (Patient not taking: Reported on 11/19/2019) 20 tablet 0   No facility-administered medications prior to visit.    Review of Systems   Patient denies headache, fevers, malaise, unintentional weight loss, skin rash, eye pain, sinus congestion and sinus pain, sore throat, dysphagia,  hemoptysis , cough, dyspnea, wheezing, chest pain, palpitations, orthopnea, edema, abdominal pain, nausea, melena, diarrhea, constipation, flank pain, dysuria, hematuria, urinary  Frequency, nocturia, numbness, tingling, seizures,  Focal weakness, Loss of consciousness,  Tremor, insomnia, depression, anxiety,  and suicidal ideation.     Objective:  BP 128/82 (BP Location: Left Arm, Patient Position: Sitting, Cuff Size: Normal)   Pulse 75   Temp (!) 97.3 F (36.3 C) (Temporal)   Resp 14   Ht 5\' 6"  (1.676 m)   Wt 142 lb 6.4 oz (64.6 kg)   SpO2 99%   BMI 22.98 kg/m   Physical Exam   . General Appearance:    Alert, cooperative, no distress, appears stated age  Head:    Normocephalic, without obvious abnormality, atraumatic  Eyes:    PERRL, conjunctiva/corneas clear, EOM's intact, fundi    benign, both eyes  Ears:    Normal TM's and external ear canals, both ears  Nose:   Nares normal, septum midline, mucosa normal, no drainage    or sinus tenderness  Throat:   Lips, mucosa, and tongue normal; teeth and gums normal  Neck:   Supple, symmetrical, trachea midline, no adenopathy;    thyroid:  no enlargement/tenderness/nodules; no carotid   bruit or JVD  Back:     Symmetric, no curvature, ROM normal, no CVA tenderness  Lungs:     Clear to auscultation bilaterally, respirations unlabored  Chest Wall:    No tenderness or deformity   Heart:    Regular rate and rhythm, S1 and S2 normal, no murmur, rub   or gallop  Breast  Exam:    No tenderness, masses, or nipple abnormality  Abdomen:     Soft, non-tender, bowel sounds active all four quadrants,    no masses, no organomegaly  Genitalia:    Pelvic: cervix normal in appearance, external genitalia normal, no adnexal masses or tenderness, no cervical motion tenderness, rectovaginal septum normal, uterus normal size, shape, and consistency and vagina normal without discharge  Extremities:   Left great toe with post surgical changes including swelling, well healed surgical incision.   Pulses:   2+ and symmetric all extremities  Skin:   Skin color, texture, turgor normal, no rashes or lesions  Lymph nodes:   Cervical, supraclavicular, and axillary nodes normal  Neurologic:   CNII-XII intact, normal strength, sensation and reflexes    throughout       Assessment & Plan:   Problem List Items Addressed This Visit      Unprioritized   Encounter for preventive health examination - Primary    age appropriate education and counseling updated, referrals for preventative services and immunizations addressed, dietary and smoking counseling addressed, most recent labs reviewed.  I have personally reviewed and have noted:  1) the patient's medical and social history 2) The pt's use of alcohol, tobacco, and illicit drugs 3) The patient's current medications and supplements 4) Functional ability including ADL's, fall risk, home safety risk, hearing and visual impairment 5) Diet and physical activities 6) Evidence for depression or mood disorder 7) The patient's height, weight, and BMI have been recorded in the chart  I have made referrals, and provided counseling and education based on review of the above      Vaginitis    Etiology may be atrophic vs early lichen sclerosis et atrophicus.  Refer to Dr Ranae Plumber for biopsy      Relevant Orders   Ambulatory referral to Gynecology   B12 deficiency   Parkinson's disease [facility])    Diagnosis confirmed by second opinion (Dr Quinn Plowman,  [facility], 2019)  Advised to RTC 4 months ,  Did not return.  She is apprehensive about resuming mirapex for the "freezing" because of concern that it was amplify the tremor.  Encouraged to consider resuming medication at the lower dose       S/P bunionectomy    Other Visit Diagnoses    Fatigue, unspecified type       Relevant Orders   Comprehensive metabolic panel (Completed)   TSH (Completed)   Hyperlipidemia LDL goal <160       Relevant Orders   Lipid panel (Completed)   Vitamin D deficiency       Relevant Orders   VITAMIN D 25 Hydroxy (Vit-D Deficiency, Fractures) (Completed)      I have discontinued Kresta H. Cadotte's fluconazole, ondansetron, and cephALEXin. I am also having her maintain her progesterone and estradiol.  No orders  of the defined types were placed in this encounter.   Medications Discontinued During This Encounter  Medication Reason  . cephALEXin (KEFLEX) 500 MG capsule Completed Course  . fluconazole (DIFLUCAN) 150 MG tablet Completed Course  . ondansetron (ZOFRAN) 4 MG tablet Patient has not taken in last 30 days    Follow-up: No follow-ups on file.   Sherlene Shams, MD

## 2019-11-20 DIAGNOSIS — Z9889 Other specified postprocedural states: Secondary | ICD-10-CM | POA: Insufficient documentation

## 2019-11-20 NOTE — Assessment & Plan Note (Signed)
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  Refer to Dr Ranae Plumber for biopsy

## 2019-11-20 NOTE — Assessment & Plan Note (Signed)

## 2019-11-27 DIAGNOSIS — R262 Difficulty in walking, not elsewhere classified: Secondary | ICD-10-CM | POA: Diagnosis not present

## 2019-11-27 DIAGNOSIS — M79672 Pain in left foot: Secondary | ICD-10-CM | POA: Diagnosis not present

## 2019-11-28 DIAGNOSIS — R262 Difficulty in walking, not elsewhere classified: Secondary | ICD-10-CM | POA: Diagnosis not present

## 2019-11-28 DIAGNOSIS — M79672 Pain in left foot: Secondary | ICD-10-CM | POA: Diagnosis not present

## 2019-12-03 ENCOUNTER — Ambulatory Visit
Admission: RE | Admit: 2019-12-03 | Discharge: 2019-12-03 | Disposition: A | Discharge status: Home / Self Care | Payer: BC Managed Care – PPO | Source: Ambulatory Visit | Attending: Internal Medicine | Admitting: Internal Medicine

## 2019-12-03 ENCOUNTER — Other Ambulatory Visit: Payer: Self-pay

## 2019-12-03 ENCOUNTER — Ambulatory Visit: Payer: BC Managed Care – PPO

## 2019-12-03 DIAGNOSIS — R928 Other abnormal and inconclusive findings on diagnostic imaging of breast: Secondary | ICD-10-CM

## 2019-12-03 DIAGNOSIS — M79672 Pain in left foot: Secondary | ICD-10-CM | POA: Diagnosis not present

## 2019-12-03 DIAGNOSIS — R262 Difficulty in walking, not elsewhere classified: Secondary | ICD-10-CM | POA: Diagnosis not present

## 2019-12-03 DIAGNOSIS — R922 Inconclusive mammogram: Secondary | ICD-10-CM | POA: Diagnosis not present

## 2019-12-04 DIAGNOSIS — M79672 Pain in left foot: Secondary | ICD-10-CM | POA: Diagnosis not present

## 2019-12-04 DIAGNOSIS — R262 Difficulty in walking, not elsewhere classified: Secondary | ICD-10-CM | POA: Diagnosis not present

## 2019-12-05 DIAGNOSIS — R262 Difficulty in walking, not elsewhere classified: Secondary | ICD-10-CM | POA: Diagnosis not present

## 2019-12-05 DIAGNOSIS — M79672 Pain in left foot: Secondary | ICD-10-CM | POA: Diagnosis not present

## 2019-12-06 ENCOUNTER — Telehealth: Payer: Self-pay | Admitting: Internal Medicine

## 2019-12-06 NOTE — Telephone Encounter (Signed)
LMTCB

## 2019-12-06 NOTE — Telephone Encounter (Signed)
Pt called about a whooping cough vaccine. Daughter is having a baby soon.

## 2019-12-10 DIAGNOSIS — M79672 Pain in left foot: Secondary | ICD-10-CM | POA: Diagnosis not present

## 2019-12-10 DIAGNOSIS — R262 Difficulty in walking, not elsewhere classified: Secondary | ICD-10-CM | POA: Diagnosis not present

## 2019-12-11 DIAGNOSIS — N898 Other specified noninflammatory disorders of vagina: Secondary | ICD-10-CM | POA: Diagnosis not present

## 2019-12-11 NOTE — Telephone Encounter (Signed)
LMTCB

## 2019-12-12 DIAGNOSIS — M79672 Pain in left foot: Secondary | ICD-10-CM | POA: Diagnosis not present

## 2019-12-12 DIAGNOSIS — R262 Difficulty in walking, not elsewhere classified: Secondary | ICD-10-CM | POA: Diagnosis not present

## 2019-12-12 NOTE — Telephone Encounter (Signed)
Patient returned office phone call. 

## 2019-12-12 NOTE — Telephone Encounter (Signed)
Spoke with pt to let her know that she is not due for another Tdap vaccine until 2024. Pt gave a verbal understanding.

## 2019-12-13 DIAGNOSIS — Z23 Encounter for immunization: Secondary | ICD-10-CM | POA: Diagnosis not present

## 2019-12-14 DIAGNOSIS — R262 Difficulty in walking, not elsewhere classified: Secondary | ICD-10-CM | POA: Diagnosis not present

## 2019-12-14 DIAGNOSIS — M79672 Pain in left foot: Secondary | ICD-10-CM | POA: Diagnosis not present

## 2019-12-18 DIAGNOSIS — R262 Difficulty in walking, not elsewhere classified: Secondary | ICD-10-CM | POA: Diagnosis not present

## 2019-12-18 DIAGNOSIS — M79672 Pain in left foot: Secondary | ICD-10-CM | POA: Diagnosis not present

## 2019-12-19 DIAGNOSIS — R262 Difficulty in walking, not elsewhere classified: Secondary | ICD-10-CM | POA: Diagnosis not present

## 2019-12-19 DIAGNOSIS — M79672 Pain in left foot: Secondary | ICD-10-CM | POA: Diagnosis not present

## 2019-12-26 ENCOUNTER — Encounter: Payer: BC Managed Care – PPO | Admitting: Podiatry

## 2019-12-31 ENCOUNTER — Ambulatory Visit (INDEPENDENT_AMBULATORY_CARE_PROVIDER_SITE_OTHER): Payer: BC Managed Care – PPO

## 2019-12-31 ENCOUNTER — Encounter: Payer: Self-pay | Admitting: Podiatry

## 2019-12-31 ENCOUNTER — Other Ambulatory Visit: Payer: Self-pay

## 2019-12-31 ENCOUNTER — Ambulatory Visit (INDEPENDENT_AMBULATORY_CARE_PROVIDER_SITE_OTHER): Payer: BC Managed Care – PPO | Admitting: Podiatry

## 2019-12-31 VITALS — Temp 97.7°F

## 2019-12-31 DIAGNOSIS — M2012 Hallux valgus (acquired), left foot: Secondary | ICD-10-CM

## 2019-12-31 DIAGNOSIS — M21962 Unspecified acquired deformity of left lower leg: Secondary | ICD-10-CM

## 2019-12-31 DIAGNOSIS — Z9889 Other specified postprocedural states: Secondary | ICD-10-CM

## 2019-12-31 NOTE — Progress Notes (Signed)
She presents today date of surgery September 14, 2019 status post Lapidus procedure including bunionectomy second metatarsal osteotomy flexor tenotomy fourth on the left.  She is visibly upset today stating that her pain is worse now than before surgery.  She states that over the past 6 weeks as she has been in physical therapy her foot has become more deformed and she is unable to dorsiflex the ankle joint.  She states that is been displaced since cast to come off.  She states that the toe has started to shift and has worsened to the point where she is having more pain across the plantar lateral aspect of the forefoot and now even her right forefoot is starting to become painful.  She has been spending 3 days a week with Thayer Ohm over the past 4 to 6 weeks from [facility].  She says he would like to try some dry needling for her calf muscle.  She states that her leg is feeling weaker and it feels even more like I slap foot at this point in time.  Objective: Vital signs are stable she is alert and oriented visibly upset.  Pulses are strongly palpable bilaterally.  Left foot does demonstrate hallux varus deformity with mallet toe formation.  Most likely the lateral capsule of the first metatarsophalangeal joint has now started to loosen and is allowing for medial deviation of the toe and contraction of the long flexor tendon.  This is resulting in pain at the joint and compensation of the ankle and the forefoot is resulting of the lateral pain of the forefoot.  Radiographically the first TMT is fused completely her alignment is good with the exception of the varus.  Everything else about the surgery looks great I think that the lateral capsule tore and with the absence of the fibular sesamoid which had to be removed in order to reduce the IM angle resulted in the medial muscles overpowering the lateral.  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  Plan: I discussed with  her in detail as she becomes visibly more upset the way this would have to be fixed.  I expressed to her that she would have to have a fusion of the first metatarsophalangeal joint and a fusion of the hallux interphalangeal joint.  I expressed to her that this would more than likely not have to be a nonweightbearing surgery that more than likely she could walk on it with a cam boot.  She states that she feels that she should have been back in here to see me during the last 6 weeks and that it was because of the lack of me seeing her over the past 6 weeks that she has now developed what she has.  She also feels that we started physical therapy to light.  She has definitely progressed in a negative manner.  And I expressed to her that be more than happy to help with her in any way I could.  She was very mad at me blamed me for this.  I expressed to her that occasionally these things happen with surgery and I am sorry that this happened to her.  She left stating that "you are sorry and I cannot walk".  I sincerely hope that we can work together to work this out and repaired this metatarsophalangeal joint so that she can stay as active as possible.  I do think that is a possibility that we may need to consider nerve conduction velocity exam  of the left leg or an EMG as well.

## 2020-01-07 ENCOUNTER — Encounter: Payer: Self-pay | Admitting: Podiatry

## 2020-01-09 NOTE — Telephone Encounter (Signed)
I spoke with patient and answered questions per Dr. Al Corpus.  She stated "I don't think it was good care, nothing was explained to me, no instructions given to me so I didn't know how long I needed to wear the boot, I still am in a lot of pain and I can't hardly walk.  I will not let him touch my foot again"  I offered her to come in to talk to one of the other providers and she said she is still discussing this with her husband and will call if she decides to see one of the other providers.

## 2020-01-14 DIAGNOSIS — Z20828 Contact with and (suspected) exposure to other viral communicable diseases: Secondary | ICD-10-CM | POA: Diagnosis not present

## 2020-01-14 DIAGNOSIS — Z03818 Encounter for observation for suspected exposure to other biological agents ruled out: Secondary | ICD-10-CM | POA: Diagnosis not present

## 2020-01-16 DIAGNOSIS — R262 Difficulty in walking, not elsewhere classified: Secondary | ICD-10-CM | POA: Diagnosis not present

## 2020-01-16 DIAGNOSIS — M79672 Pain in left foot: Secondary | ICD-10-CM | POA: Diagnosis not present

## 2020-01-17 DIAGNOSIS — M79671 Pain in right foot: Secondary | ICD-10-CM | POA: Diagnosis not present

## 2020-01-17 DIAGNOSIS — M7742 Metatarsalgia, left foot: Secondary | ICD-10-CM | POA: Diagnosis not present

## 2020-01-17 DIAGNOSIS — M7732 Calcaneal spur, left foot: Secondary | ICD-10-CM | POA: Diagnosis not present

## 2020-01-17 DIAGNOSIS — M2011 Hallux valgus (acquired), right foot: Secondary | ICD-10-CM | POA: Diagnosis not present

## 2020-01-17 DIAGNOSIS — Z981 Arthrodesis status: Secondary | ICD-10-CM | POA: Diagnosis not present

## 2020-01-17 DIAGNOSIS — M2032 Hallux varus (acquired), left foot: Secondary | ICD-10-CM | POA: Diagnosis not present

## 2020-01-17 DIAGNOSIS — M79672 Pain in left foot: Secondary | ICD-10-CM | POA: Diagnosis not present

## 2020-01-17 DIAGNOSIS — M205X2 Other deformities of toe(s) (acquired), left foot: Secondary | ICD-10-CM | POA: Diagnosis not present

## 2020-01-24 DIAGNOSIS — Z01818 Encounter for other preprocedural examination: Secondary | ICD-10-CM | POA: Diagnosis not present

## 2020-01-24 DIAGNOSIS — M7742 Metatarsalgia, left foot: Secondary | ICD-10-CM | POA: Diagnosis not present

## 2020-01-24 DIAGNOSIS — M79671 Pain in right foot: Secondary | ICD-10-CM | POA: Diagnosis not present

## 2020-01-24 DIAGNOSIS — M19072 Primary osteoarthritis, left ankle and foot: Secondary | ICD-10-CM | POA: Diagnosis not present

## 2020-01-24 DIAGNOSIS — Z981 Arthrodesis status: Secondary | ICD-10-CM | POA: Diagnosis not present

## 2020-01-24 DIAGNOSIS — M205X2 Other deformities of toe(s) (acquired), left foot: Secondary | ICD-10-CM | POA: Diagnosis not present

## 2020-01-24 DIAGNOSIS — M79672 Pain in left foot: Secondary | ICD-10-CM | POA: Diagnosis not present

## 2020-02-12 ENCOUNTER — Telehealth: Payer: Self-pay | Admitting: Internal Medicine

## 2020-02-12 MED ORDER — PROGESTERONE 200 MG PO CAPS: ORAL_CAPSULE | ORAL | 3 refills | Status: DC

## 2020-02-12 NOTE — Telephone Encounter (Signed)
Refilled,  And MyChart message sent  

## 2020-02-12 NOTE — Telephone Encounter (Signed)
Pt needs progesterone refilled. She has called pharmacy several times but they told her we have not responded to their request. Pt is currently out of medication and will be going out of town tomorrow.

## 2020-02-12 NOTE — Telephone Encounter (Signed)
I am unable to refill the progesterone. I keep getting a notification stating you have to pick another medication, this one is no longer able to be ordered. Please advise.

## 2020-02-18 DIAGNOSIS — M2032 Hallux varus (acquired), left foot: Secondary | ICD-10-CM | POA: Diagnosis not present

## 2020-03-04 DIAGNOSIS — M79672 Pain in left foot: Secondary | ICD-10-CM | POA: Diagnosis not present

## 2020-03-06 DIAGNOSIS — R269 Unspecified abnormalities of gait and mobility: Secondary | ICD-10-CM | POA: Diagnosis not present

## 2020-03-06 DIAGNOSIS — R262 Difficulty in walking, not elsewhere classified: Secondary | ICD-10-CM | POA: Diagnosis not present

## 2020-03-06 DIAGNOSIS — M79672 Pain in left foot: Secondary | ICD-10-CM | POA: Diagnosis not present

## 2020-03-11 DIAGNOSIS — M79672 Pain in left foot: Secondary | ICD-10-CM | POA: Diagnosis not present

## 2020-03-11 DIAGNOSIS — R262 Difficulty in walking, not elsewhere classified: Secondary | ICD-10-CM | POA: Diagnosis not present

## 2020-03-11 DIAGNOSIS — R269 Unspecified abnormalities of gait and mobility: Secondary | ICD-10-CM | POA: Diagnosis not present

## 2020-03-17 DIAGNOSIS — M25572 Pain in left ankle and joints of left foot: Secondary | ICD-10-CM | POA: Diagnosis not present

## 2020-03-17 DIAGNOSIS — R262 Difficulty in walking, not elsewhere classified: Secondary | ICD-10-CM | POA: Diagnosis not present

## 2020-03-19 DIAGNOSIS — D2262 Melanocytic nevi of left upper limb, including shoulder: Secondary | ICD-10-CM | POA: Diagnosis not present

## 2020-03-19 DIAGNOSIS — D2261 Melanocytic nevi of right upper limb, including shoulder: Secondary | ICD-10-CM | POA: Diagnosis not present

## 2020-03-19 DIAGNOSIS — D2271 Melanocytic nevi of right lower limb, including hip: Secondary | ICD-10-CM | POA: Diagnosis not present

## 2020-03-19 DIAGNOSIS — D225 Melanocytic nevi of trunk: Secondary | ICD-10-CM | POA: Diagnosis not present

## 2020-03-20 DIAGNOSIS — M25572 Pain in left ankle and joints of left foot: Secondary | ICD-10-CM | POA: Diagnosis not present

## 2020-03-20 DIAGNOSIS — R262 Difficulty in walking, not elsewhere classified: Secondary | ICD-10-CM | POA: Diagnosis not present

## 2020-03-25 DIAGNOSIS — R262 Difficulty in walking, not elsewhere classified: Secondary | ICD-10-CM | POA: Diagnosis not present

## 2020-03-25 DIAGNOSIS — M25572 Pain in left ankle and joints of left foot: Secondary | ICD-10-CM | POA: Diagnosis not present

## 2020-03-27 DIAGNOSIS — R262 Difficulty in walking, not elsewhere classified: Secondary | ICD-10-CM | POA: Diagnosis not present

## 2020-03-27 DIAGNOSIS — M25572 Pain in left ankle and joints of left foot: Secondary | ICD-10-CM | POA: Diagnosis not present

## 2020-04-01 ENCOUNTER — Telehealth: Payer: Self-pay | Admitting: Internal Medicine

## 2020-04-01 DIAGNOSIS — M24572 Contracture, left ankle: Secondary | ICD-10-CM | POA: Diagnosis not present

## 2020-04-01 DIAGNOSIS — M7742 Metatarsalgia, left foot: Secondary | ICD-10-CM | POA: Diagnosis not present

## 2020-04-01 DIAGNOSIS — M205X2 Other deformities of toe(s) (acquired), left foot: Secondary | ICD-10-CM | POA: Diagnosis not present

## 2020-04-01 DIAGNOSIS — M2032 Hallux varus (acquired), left foot: Secondary | ICD-10-CM | POA: Diagnosis not present

## 2020-04-01 MED ORDER — ESTRADIOL 1 MG PO TABS: ORAL_TABLET | ORAL | 1 refills | Status: DC

## 2020-04-01 NOTE — Telephone Encounter (Signed)
Pt needs refill on estradiol (ESTRACE) 1 MG tablet

## 2020-04-02 DIAGNOSIS — R262 Difficulty in walking, not elsewhere classified: Secondary | ICD-10-CM | POA: Diagnosis not present

## 2020-04-02 DIAGNOSIS — M25572 Pain in left ankle and joints of left foot: Secondary | ICD-10-CM | POA: Diagnosis not present

## 2020-04-03 DIAGNOSIS — R262 Difficulty in walking, not elsewhere classified: Secondary | ICD-10-CM | POA: Diagnosis not present

## 2020-04-03 DIAGNOSIS — M25572 Pain in left ankle and joints of left foot: Secondary | ICD-10-CM | POA: Diagnosis not present

## 2020-04-07 DIAGNOSIS — R262 Difficulty in walking, not elsewhere classified: Secondary | ICD-10-CM | POA: Diagnosis not present

## 2020-04-07 DIAGNOSIS — M25572 Pain in left ankle and joints of left foot: Secondary | ICD-10-CM | POA: Diagnosis not present

## 2020-04-09 DIAGNOSIS — R262 Difficulty in walking, not elsewhere classified: Secondary | ICD-10-CM | POA: Diagnosis not present

## 2020-04-09 DIAGNOSIS — M25572 Pain in left ankle and joints of left foot: Secondary | ICD-10-CM | POA: Diagnosis not present

## 2020-05-01 DIAGNOSIS — G20 Parkinson's disease: Secondary | ICD-10-CM | POA: Diagnosis not present

## 2020-06-13 ENCOUNTER — Ambulatory Visit: Payer: BC Managed Care – PPO

## 2020-06-20 ENCOUNTER — Ambulatory Visit (INDEPENDENT_AMBULATORY_CARE_PROVIDER_SITE_OTHER): Payer: BC Managed Care – PPO

## 2020-06-20 ENCOUNTER — Other Ambulatory Visit: Payer: Self-pay

## 2020-06-20 DIAGNOSIS — Z23 Encounter for immunization: Secondary | ICD-10-CM

## 2020-06-30 DIAGNOSIS — M7732 Calcaneal spur, left foot: Secondary | ICD-10-CM | POA: Diagnosis not present

## 2020-06-30 DIAGNOSIS — M7742 Metatarsalgia, left foot: Secondary | ICD-10-CM | POA: Diagnosis not present

## 2020-06-30 DIAGNOSIS — M24572 Contracture, left ankle: Secondary | ICD-10-CM | POA: Diagnosis not present

## 2020-06-30 DIAGNOSIS — M205X2 Other deformities of toe(s) (acquired), left foot: Secondary | ICD-10-CM | POA: Diagnosis not present

## 2020-06-30 DIAGNOSIS — Z981 Arthrodesis status: Secondary | ICD-10-CM | POA: Diagnosis not present

## 2020-06-30 DIAGNOSIS — M2032 Hallux varus (acquired), left foot: Secondary | ICD-10-CM | POA: Diagnosis not present

## 2020-06-30 DIAGNOSIS — M19072 Primary osteoarthritis, left ankle and foot: Secondary | ICD-10-CM | POA: Diagnosis not present

## 2020-07-11 ENCOUNTER — Other Ambulatory Visit: Payer: Self-pay

## 2020-07-11 ENCOUNTER — Telehealth: Payer: BC Managed Care – PPO | Admitting: Family Medicine

## 2020-07-11 ENCOUNTER — Telehealth (INDEPENDENT_AMBULATORY_CARE_PROVIDER_SITE_OTHER): Payer: BC Managed Care – PPO | Admitting: Nurse Practitioner

## 2020-07-11 ENCOUNTER — Telehealth: Payer: Self-pay

## 2020-07-11 ENCOUNTER — Encounter: Payer: Self-pay | Admitting: Nurse Practitioner

## 2020-07-11 VITALS — HR 70 | Resp 18 | Ht 66.0 in | Wt 134.0 lb

## 2020-07-11 DIAGNOSIS — Z20822 Contact with and (suspected) exposure to covid-19: Secondary | ICD-10-CM | POA: Diagnosis not present

## 2020-07-11 DIAGNOSIS — J029 Acute pharyngitis, unspecified: Secondary | ICD-10-CM | POA: Diagnosis not present

## 2020-07-11 DIAGNOSIS — J069 Acute upper respiratory infection, unspecified: Secondary | ICD-10-CM | POA: Diagnosis not present

## 2020-07-11 LAB — POCT RAPID STREP A (OFFICE): Rapid Strep A Screen: NEGATIVE

## 2020-07-11 LAB — POCT INFLUENZA A/B
Influenza A, POC: NEGATIVE
Influenza B, POC: NEGATIVE

## 2020-07-11 MED ORDER — AZITHROMYCIN 250 MG PO TABS: ORAL_TABLET | ORAL | 0 refills | Status: DC

## 2020-07-11 MED ORDER — BENZONATATE 100 MG PO CAPS
200.0000 mg | ORAL_CAPSULE | Freq: Three times a day (TID) | ORAL | 0 refills | Status: DC | PRN
Start: 2020-07-11 — End: 2021-08-12

## 2020-07-11 NOTE — Telephone Encounter (Signed)
LMTCB

## 2020-07-11 NOTE — Addendum Note (Signed)
Addended by: Warden Fillers on: 07/11/2020 09:48 AM   Modules accepted: Orders

## 2020-07-11 NOTE — Progress Notes (Signed)
Virtual Visit via Virtual  Note  This visit type was conducted due to national recommendations for restrictions regarding the COVID-19 pandemic (e.g. social distancing).  This format is felt to be most appropriate for this patient at this time.  All issues noted in this document were discussed and addressed.  No physical exam was performed (except for noted visual exam findings with Video Visits).   I connected with@ on 07/11/20 at  8:00 AM EDT by a video enabled telemedicine application or telephone and verified that I am speaking with the correct person using two identifiers. Location patient: home Location provider: work or home office Persons participating in the virtual visit: patient, provider  I discussed the limitations, risks, security and privacy concerns of performing an evaluation and management service by telephone and the availability of in person appointments. I also discussed with the patient that there may be a patient responsible charge related to this service. The patient expressed understanding and agreed to proceed.   Reason for visit: Sore throat   HPI: Ms. Drummonds is a 59 year old with history of Parkinson's, and hx of  tonsillectomy reports onset of a sore throat on Monday.  Her husband had a sore throat on Sunday.  She has had sore throat now all week that is quite severe and is continuous.  She has no trouble swallowing food or liquid.  She does not feel like her throat is swollen.  She has developed just a little cough over the last day, and she coughs so much she has little bit of soreness under her breasts.  Nonproductive. She has no nasal congestion or postnasal drainage.   No chest pain pressure heaviness tightness.  No wheezing.  No shortness of breath or DOE.  She has noted no fevers but she does not have a thermometer.  She thinks she may have had a few chills however so it is possible.  She has no body aches, GI concerns, change in taste or smell.  He has taken  ibuprofen.  She did take a home Covid test on Tuesday it was negative.   She did have the Covid vaccine series and in Aug  she did have positive Covid infection.  That was present with postnasal drainage and sore throat.  Mild case.   ROS: See pertinent positives and negatives per HPI.  Past Medical History:  Diagnosis Date  . Heart murmur   . History of chicken pox   . Urine, incontinence, stress female    leaking with laughter or very full bladder    Past Surgical History:  Procedure Laterality Date  . NO PAST SURGERIES      Family History  Problem Relation Age of Onset  . Ulcerative colitis Mother   . Leukemia Mother   . Parkinsonism Mother 28       drug induced AML  . COPD Father   . Diabetes Father   . Cancer Father        Renal Cell CA, Lung CA  . Healthy Sister   . Healthy Brother   . Healthy Child   . Breast cancer Neg Hx     SOCIAL HX: Non-smoker   Current Outpatient Medications:  .  carbidopa-levodopa (SINEMET IR) 25-100 MG tablet, Take by mouth., Disp: , Rfl:  .  estradiol (ESTRACE) 1 MG tablet, TAKE 1 TABLET(1 MG) BY MOUTH DAILY, Disp: 90 tablet, Rfl: 1 .  progesterone (PROMETRIUM) 200 MG capsule, 1 capsule nightly for 12 days per month, Disp: 36 capsule,  Rfl: 3 .  benzonatate (TESSALON PERLES) 100 MG capsule, Take 2 capsules (200 mg total) by mouth 3 (three) times daily as needed for cough., Disp: 20 capsule, Rfl: 0  EXAM:  VITALS per patient if applicable: Weight 134 pounds.  GENERAL: alert, oriented, appears well and in no acute distress  HEENT: atraumatic, conjunctiva clear, no obvious abnormalities on inspection of external nose and ears.  Positive nasal congested tone to the voice.  Patient noted her throat has  redness.  Absent tonsils.  NECK: normal movements of the head and neck  LUNGS: on inspection no signs of respiratory distress, breathing rate appears normal, no obvious gross SOB, gasping or wheezing  CV: no obvious cyanosis  MS:  moves all visible extremities without noticeable abnormality  PSYCH/NEURO: pleasant and cooperative, no obvious depression or anxiety, speech and thought processing grossly intact  ASSESSMENT AND PLAN:  Discussed the following assessment and plan:  Acute pharyngitis, unspecified etiology - Plan: POCT rapid strep A, POCT Influenza A/B, Novel Coronavirus, NAA ([facility])  Suspected COVID-19 virus infection - Plan: Novel Coronavirus, NAA ([facility])  Upper respiratory tract infection, unspecified type Please come to the drive through today at the back of the clinic to get tested for strep, flu and Covid.   Warm salt water gargles and monitor fever- obtain thermometer today. Supportive measures:  Get rest, drink plenty of fluids, and use tylenol or ibuprofen as needed for pain.  Tessalon Perles prescription to take as needed for cough x3 per day.   Further recommendations pending culture results.   Follow up if fever >101, if symptoms worsen or if symptoms are not improved in 3 days.   I discussed the assessment and treatment plan with the patient. The patient was provided an opportunity to ask questions and all were answered. The patient agreed with the plan and demonstrated an understanding of the instructions.   The patient was advised to call back or seek an in-person evaluation if the symptoms worsen or if the condition fails to improve as anticipated.  Amedeo Kinsman, NP Adult Nurse Practitioner [facility] [employer] [phone]

## 2020-07-11 NOTE — Telephone Encounter (Signed)
Pt states that she would like an antibiotic to take over the weekend so she will be better before Wednesday. She has surgery scheduled that day

## 2020-07-11 NOTE — Addendum Note (Signed)
Addended by: Warden Fillers on: 07/11/2020 02:56 PM   Modules accepted: Orders

## 2020-07-11 NOTE — Patient Instructions (Addendum)
Please come to the drive through today at the back of the clinic to get tested for strep, flu and Covid.   Warm salt water gargles and monitor fever- obtain thermometer today. Supportive measures:  Get rest, drink plenty of fluids, and use tylenol or ibuprofen as needed for pain.  Tessalon Perles prescription to take as needed for cough x3 per day.   Further recommendations pending culture results.   Follow up if fever >101, if symptoms worsen or if symptoms are not improved in 3 days.   Upper Respiratory Infection, Adult An upper respiratory infection (URI) affects the nose, throat, and upper air passages. URIs are caused by germs (viruses). The most common type of URI is often called "the common cold." Medicines cannot cure URIs, but you can do things at home to relieve your symptoms. URIs usually get better within 7-10 days. Follow these instructions at home: Activity  Rest as needed.  If you have a fever, stay home from work or school until your fever is gone, or until your doctor says you may return to work or school. ? You should stay home until you cannot spread the infection anymore (you are not contagious). ? Your doctor may have you wear a face mask so you have less risk of spreading the infection. Relieving symptoms  Gargle with a salt-water mixture 3-4 times a day or as needed. To make a salt-water mixture, completely dissolve -1 tsp of salt in 1 cup of warm water.  Use a cool-mist humidifier to add moisture to the air. This can help you breathe more easily. Eating and drinking   Drink enough fluid to keep your pee (urine) pale yellow.  Eat soups and other clear broths. General instructions   Take over-the-counter and prescription medicines only as told by your doctor. These include cold medicines, fever reducers, and cough suppressants.  Do not use any products that contain nicotine or tobacco. These include cigarettes and e-cigarettes. If you need help quitting,  ask your doctor.  Avoid being where people are smoking (avoid secondhand smoke).  Make sure you get regular shots and get the flu shot every year.  Keep all follow-up visits as told by your doctor. This is important. How to avoid spreading infection to others   Wash your hands often with soap and water. If you do not have soap and water, use hand sanitizer.  Avoid touching your mouth, face, eyes, or nose.  Cough or sneeze into a tissue or your sleeve or elbow. Do not cough or sneeze into your hand or into the air. Contact a doctor if:  You are getting worse, not better.  You have any of these: ? A fever. ? Chills. ? Brown or red mucus in your nose. ? Yellow or brown fluid (discharge)coming from your nose. ? Pain in your face, especially when you bend forward. ? Swollen neck glands. ? Pain with swallowing. ? White areas in the back of your throat. Get help right away if:  You have shortness of breath that gets worse.  You have very bad or constant: ? Headache. ? Ear pain. ? Pain in your forehead, behind your eyes, and over your cheekbones (sinus pain). ? Chest pain.  You have long-lasting (chronic) lung disease along with any of these: ? Wheezing. ? Long-lasting cough. ? Coughing up blood. ? A change in your usual mucus.  You have a stiff neck.  You have changes in your: ? Vision. ? Hearing. ? Thinking. ? Mood. Summary  An upper respiratory infection (URI) is caused by a germ called a virus. The most common type of URI is often called "the common cold."  URIs usually get better within 7-10 days.  Take over-the-counter and prescription medicines only as told by your doctor. This information is not intended to replace advice given to you by your health care provider. Make sure you discuss any questions you have with your health care provider. Document Revised: 08/31/2018 Document Reviewed: 04/15/2017 Elsevier Patient Education  2020 Elsevier  Inc.  Pharyngitis  Pharyngitis is redness, pain, and swelling (inflammation) of the throat (pharynx). It is a very common cause of sore throat. Pharyngitis can be caused by a bacteria, but it is usually caused by a virus. Most cases of pharyngitis get better on their own without treatment. What are the causes? This condition may be caused by:  Infection by viruses (viral). Viral pharyngitis spreads from person to person (is contagious) through coughing, sneezing, and sharing of personal items or utensils such as cups, forks, spoons, and toothbrushes.  Infection by bacteria (bacterial). Bacterial pharyngitis may be spread by touching the nose or face after coming in contact with the bacteria, or through more intimate contact, such as kissing.  Allergies. Allergies can cause buildup of mucus in the throat (post-nasal drip), leading to inflammation and irritation. Allergies can also cause blocked nasal passages, forcing breathing through the mouth, which dries and irritates the throat. What increases the risk? You are more likely to develop this condition if:  You are 46-70 years old.  You are exposed to crowded environments such as daycare, school, or dormitory living.  You live in a cold climate.  You have a weakened disease-fighting (immune) system. What are the signs or symptoms? Symptoms of this condition vary by the cause (viral, bacterial, or allergies) and can include:  Sore throat.  Fatigue.  Low-grade fever.  Headache.  Joint pain and muscle aches.  Skin rashes.  Swollen glands in the throat (lymph nodes).  Plaque-like film on the throat or tonsils. This is often a symptom of bacterial pharyngitis.  Vomiting.  Stuffy nose (nasal congestion).  Cough.  Red, itchy eyes (conjunctivitis).  Loss of appetite. How is this diagnosed? This condition is often diagnosed based on your medical history and a physical exam. Your health care provider will ask you questions  about your illness and your symptoms. A swab of your throat may be done to check for bacteria (rapid strep test). Other lab tests may also be done, depending on the suspected cause, but these are rare. How is this treated? This condition usually gets better in 3-4 days without medicine. Bacterial pharyngitis may be treated with antibiotic medicines. Follow these instructions at home:  Take over-the-counter and prescription medicines only as told by your health care provider. ? If you were prescribed an antibiotic medicine, take it as told by your health care provider. Do not stop taking the antibiotic even if you start to feel better. ? Do not give children aspirin because of the association with Reye syndrome.  Drink enough water and fluids to keep your urine clear or pale yellow.  Get a lot of rest.  Gargle with a salt-water mixture 3-4 times a day or as needed. To make a salt-water mixture, completely dissolve -1 tsp of salt in 1 cup of warm water.  If your health care provider approves, you may use throat lozenges or sprays to soothe your throat. Contact a health care provider if:  You have  large, tender lumps in your neck.  You have a rash.  You cough up green, yellow-brown, or bloody spit. Get help right away if:  Your neck becomes stiff.  You drool or are unable to swallow liquids.  You cannot drink or take medicines without vomiting.  You have severe pain that does not go away, even after you take medicine.  You have trouble breathing, and it is not caused by a stuffy nose.  You have new pain and swelling in your joints such as the knees, ankles, wrists, or elbows. Summary  Pharyngitis is redness, pain, and swelling (inflammation) of the throat (pharynx).  While pharyngitis can be caused by a bacteria, the most common causes are viral.  Most cases of pharyngitis get better on their own without treatment.  Bacterial pharyngitis is treated with antibiotic  medicines. This information is not intended to replace advice given to you by your health care provider. Make sure you discuss any questions you have with your health care provider. Document Revised: 08/05/2017 Document Reviewed: 09/28/2016 Elsevier Patient Education  2020 [employer].

## 2020-07-11 NOTE — Telephone Encounter (Signed)
I will order a Zpak for her. Also, start on a probiotic and yogurt with live culture to help prevent diarrhea. If no improvement in 2 days- go to an in person clinic Acute Care- for an exam.   Please ask her to notify the surgeon that she has this sore throat and was given a Zpak-. The anesthesiologist is very careful about the airway and infection in the upper respiratory system.

## 2020-07-12 LAB — NOVEL CORONAVIRUS, NAA: SARS-CoV-2, NAA: NOT DETECTED

## 2020-07-12 LAB — SARS-COV-2, NAA 2 DAY TAT

## 2020-07-14 ENCOUNTER — Telehealth: Payer: Self-pay | Admitting: Internal Medicine

## 2020-07-14 NOTE — Telephone Encounter (Signed)
Discussed symptoms with patient who is having surgery on Wednesday  No fevers or body aches.  No pleurisy or shortness of breath. Feeling gradually better since Friday .  Throat feeling better still sore but less so. Some sinus congestion which started on Friday along with a cough is dry and hacking .  Eyes have also developed some mild conjunctvitis  Over the last 24-48 hours.    Symptoms of sore throat stated one week ago.   Husband was also sick and covid negative. Advised her to start the Azithromycin 500 mg daily,  And to take 250 mg on Tuesday and Wednesday.

## 2020-07-14 NOTE — Telephone Encounter (Signed)
Patient called she stated that she was very confused about some medication and she is getting ready to have surgery need Dr.Tullo to call her when she get a chance please.

## 2020-07-14 NOTE — Telephone Encounter (Signed)
I spoke with patient earlier this morning to see if she was able to pick up her prescription and to see how she was doing. Patient states she called back to talk to me Friday afternoon but there was nothing sent to me that she called back. Patient states she did not know there was a prescription at [facility]; they sent her notification that there was an rx sent but when she called them they told her there was nothing there. Patient is mad that she did not get this call and is really upset she may not be able to have her surgery that she waited 10 months for. I advised patient so speak to her surgeon about all of this since she had been sick. Patient was very upset and nasty to me this morning.   I spoke with Olegario Messier after this call and she checked access nurse to see if patient called back since there was nothing sent to me. Patient spoke with access nurse at 5:00 pm which would have not come back to me.

## 2020-07-16 DIAGNOSIS — M19072 Primary osteoarthritis, left ankle and foot: Secondary | ICD-10-CM | POA: Diagnosis not present

## 2020-07-16 DIAGNOSIS — G20 Parkinson's disease: Secondary | ICD-10-CM | POA: Diagnosis not present

## 2020-07-16 DIAGNOSIS — Z79899 Other long term (current) drug therapy: Secondary | ICD-10-CM | POA: Diagnosis not present

## 2020-07-16 DIAGNOSIS — M205X2 Other deformities of toe(s) (acquired), left foot: Secondary | ICD-10-CM | POA: Diagnosis not present

## 2020-07-16 DIAGNOSIS — Z8616 Personal history of COVID-19: Secondary | ICD-10-CM | POA: Diagnosis not present

## 2020-07-16 DIAGNOSIS — Z967 Presence of other bone and tendon implants: Secondary | ICD-10-CM | POA: Diagnosis not present

## 2020-07-16 DIAGNOSIS — M2032 Hallux varus (acquired), left foot: Secondary | ICD-10-CM | POA: Diagnosis not present

## 2020-07-16 DIAGNOSIS — Z9889 Other specified postprocedural states: Secondary | ICD-10-CM | POA: Diagnosis not present

## 2020-07-16 DIAGNOSIS — Z981 Arthrodesis status: Secondary | ICD-10-CM | POA: Diagnosis not present

## 2020-07-16 DIAGNOSIS — Z791 Long term (current) use of non-steroidal anti-inflammatories (NSAID): Secondary | ICD-10-CM | POA: Diagnosis not present

## 2020-07-25 DIAGNOSIS — G20 Parkinson's disease: Secondary | ICD-10-CM | POA: Diagnosis not present

## 2020-07-25 DIAGNOSIS — T86828 Other complications of skin graft (allograft) (autograft): Secondary | ICD-10-CM | POA: Diagnosis not present

## 2020-07-25 DIAGNOSIS — Y832 Surgical operation with anastomosis, bypass or graft as the cause of abnormal reaction of the patient, or of later complication, without mention of misadventure at the time of the procedure: Secondary | ICD-10-CM | POA: Diagnosis not present

## 2020-07-25 DIAGNOSIS — I96 Gangrene, not elsewhere classified: Secondary | ICD-10-CM | POA: Diagnosis not present

## 2020-07-25 DIAGNOSIS — I771 Stricture of artery: Secondary | ICD-10-CM | POA: Diagnosis not present

## 2020-07-25 DIAGNOSIS — Z79899 Other long term (current) drug therapy: Secondary | ICD-10-CM | POA: Diagnosis not present

## 2020-07-25 DIAGNOSIS — Z780 Asymptomatic menopausal state: Secondary | ICD-10-CM | POA: Diagnosis not present

## 2020-07-28 DIAGNOSIS — Y830 Surgical operation with transplant of whole organ as the cause of abnormal reaction of the patient, or of later complication, without mention of misadventure at the time of the procedure: Secondary | ICD-10-CM | POA: Diagnosis not present

## 2020-07-28 DIAGNOSIS — T86828 Other complications of skin graft (allograft) (autograft): Secondary | ICD-10-CM | POA: Diagnosis not present

## 2020-07-28 DIAGNOSIS — Z4789 Encounter for other orthopedic aftercare: Secondary | ICD-10-CM | POA: Diagnosis not present

## 2020-07-28 DIAGNOSIS — I96 Gangrene, not elsewhere classified: Secondary | ICD-10-CM | POA: Diagnosis not present

## 2020-07-28 DIAGNOSIS — I771 Stricture of artery: Secondary | ICD-10-CM | POA: Diagnosis not present

## 2020-07-29 DIAGNOSIS — Y838 Other surgical procedures as the cause of abnormal reaction of the patient, or of later complication, without mention of misadventure at the time of the procedure: Secondary | ICD-10-CM | POA: Diagnosis not present

## 2020-07-29 DIAGNOSIS — G20 Parkinson's disease: Secondary | ICD-10-CM | POA: Diagnosis not present

## 2020-07-29 DIAGNOSIS — T86828 Other complications of skin graft (allograft) (autograft): Secondary | ICD-10-CM | POA: Diagnosis not present

## 2020-07-29 DIAGNOSIS — I739 Peripheral vascular disease, unspecified: Secondary | ICD-10-CM | POA: Diagnosis not present

## 2020-07-29 DIAGNOSIS — I96 Gangrene, not elsewhere classified: Secondary | ICD-10-CM | POA: Diagnosis not present

## 2020-07-30 DIAGNOSIS — T86828 Other complications of skin graft (allograft) (autograft): Secondary | ICD-10-CM | POA: Diagnosis not present

## 2020-07-30 DIAGNOSIS — I739 Peripheral vascular disease, unspecified: Secondary | ICD-10-CM | POA: Diagnosis not present

## 2020-07-30 DIAGNOSIS — Y838 Other surgical procedures as the cause of abnormal reaction of the patient, or of later complication, without mention of misadventure at the time of the procedure: Secondary | ICD-10-CM | POA: Diagnosis not present

## 2020-07-30 DIAGNOSIS — I96 Gangrene, not elsewhere classified: Secondary | ICD-10-CM | POA: Diagnosis not present

## 2020-08-04 DIAGNOSIS — L7682 Other postprocedural complications of skin and subcutaneous tissue: Secondary | ICD-10-CM | POA: Diagnosis not present

## 2020-08-04 DIAGNOSIS — I96 Gangrene, not elsewhere classified: Secondary | ICD-10-CM | POA: Diagnosis not present

## 2020-08-04 DIAGNOSIS — Z79890 Hormone replacement therapy (postmenopausal): Secondary | ICD-10-CM | POA: Diagnosis not present

## 2020-08-04 DIAGNOSIS — T86828 Other complications of skin graft (allograft) (autograft): Secondary | ICD-10-CM | POA: Diagnosis not present

## 2020-08-04 DIAGNOSIS — G20 Parkinson's disease: Secondary | ICD-10-CM | POA: Diagnosis not present

## 2020-08-04 DIAGNOSIS — I739 Peripheral vascular disease, unspecified: Secondary | ICD-10-CM | POA: Diagnosis not present

## 2020-08-05 DIAGNOSIS — Y830 Surgical operation with transplant of whole organ as the cause of abnormal reaction of the patient, or of later complication, without mention of misadventure at the time of the procedure: Secondary | ICD-10-CM | POA: Diagnosis not present

## 2020-08-05 DIAGNOSIS — I96 Gangrene, not elsewhere classified: Secondary | ICD-10-CM | POA: Diagnosis not present

## 2020-08-05 DIAGNOSIS — T86828 Other complications of skin graft (allograft) (autograft): Secondary | ICD-10-CM | POA: Diagnosis not present

## 2020-08-05 DIAGNOSIS — I739 Peripheral vascular disease, unspecified: Secondary | ICD-10-CM | POA: Diagnosis not present

## 2020-08-05 DIAGNOSIS — Z4789 Encounter for other orthopedic aftercare: Secondary | ICD-10-CM | POA: Diagnosis not present

## 2020-08-05 DIAGNOSIS — I998 Other disorder of circulatory system: Secondary | ICD-10-CM | POA: Diagnosis not present

## 2020-08-06 DIAGNOSIS — Y838 Other surgical procedures as the cause of abnormal reaction of the patient, or of later complication, without mention of misadventure at the time of the procedure: Secondary | ICD-10-CM | POA: Diagnosis not present

## 2020-08-06 DIAGNOSIS — I96 Gangrene, not elsewhere classified: Secondary | ICD-10-CM | POA: Diagnosis not present

## 2020-08-06 DIAGNOSIS — T86828 Other complications of skin graft (allograft) (autograft): Secondary | ICD-10-CM | POA: Diagnosis not present

## 2020-08-06 DIAGNOSIS — I739 Peripheral vascular disease, unspecified: Secondary | ICD-10-CM | POA: Diagnosis not present

## 2020-08-07 DIAGNOSIS — I96 Gangrene, not elsewhere classified: Secondary | ICD-10-CM | POA: Diagnosis not present

## 2020-08-07 DIAGNOSIS — T86828 Other complications of skin graft (allograft) (autograft): Secondary | ICD-10-CM | POA: Diagnosis not present

## 2020-08-07 DIAGNOSIS — Y832 Surgical operation with anastomosis, bypass or graft as the cause of abnormal reaction of the patient, or of later complication, without mention of misadventure at the time of the procedure: Secondary | ICD-10-CM | POA: Diagnosis not present

## 2020-08-08 DIAGNOSIS — I96 Gangrene, not elsewhere classified: Secondary | ICD-10-CM | POA: Diagnosis not present

## 2020-08-08 DIAGNOSIS — Y830 Surgical operation with transplant of whole organ as the cause of abnormal reaction of the patient, or of later complication, without mention of misadventure at the time of the procedure: Secondary | ICD-10-CM | POA: Diagnosis not present

## 2020-08-08 DIAGNOSIS — T86828 Other complications of skin graft (allograft) (autograft): Secondary | ICD-10-CM | POA: Diagnosis not present

## 2020-08-11 DIAGNOSIS — Y838 Other surgical procedures as the cause of abnormal reaction of the patient, or of later complication, without mention of misadventure at the time of the procedure: Secondary | ICD-10-CM | POA: Diagnosis not present

## 2020-08-11 DIAGNOSIS — Z4789 Encounter for other orthopedic aftercare: Secondary | ICD-10-CM | POA: Diagnosis not present

## 2020-08-11 DIAGNOSIS — I96 Gangrene, not elsewhere classified: Secondary | ICD-10-CM | POA: Diagnosis not present

## 2020-08-11 DIAGNOSIS — M2032 Hallux varus (acquired), left foot: Secondary | ICD-10-CM | POA: Diagnosis not present

## 2020-08-11 DIAGNOSIS — Z780 Asymptomatic menopausal state: Secondary | ICD-10-CM | POA: Diagnosis not present

## 2020-08-11 DIAGNOSIS — Z79890 Hormone replacement therapy (postmenopausal): Secondary | ICD-10-CM | POA: Diagnosis not present

## 2020-08-11 DIAGNOSIS — I739 Peripheral vascular disease, unspecified: Secondary | ICD-10-CM | POA: Diagnosis not present

## 2020-08-11 DIAGNOSIS — M205X2 Other deformities of toe(s) (acquired), left foot: Secondary | ICD-10-CM | POA: Diagnosis not present

## 2020-08-11 DIAGNOSIS — M7742 Metatarsalgia, left foot: Secondary | ICD-10-CM | POA: Diagnosis not present

## 2020-08-11 DIAGNOSIS — G20 Parkinson's disease: Secondary | ICD-10-CM | POA: Diagnosis not present

## 2020-08-11 DIAGNOSIS — T86828 Other complications of skin graft (allograft) (autograft): Secondary | ICD-10-CM | POA: Diagnosis not present

## 2020-08-12 DIAGNOSIS — I739 Peripheral vascular disease, unspecified: Secondary | ICD-10-CM | POA: Diagnosis not present

## 2020-08-12 DIAGNOSIS — I96 Gangrene, not elsewhere classified: Secondary | ICD-10-CM | POA: Diagnosis not present

## 2020-08-12 DIAGNOSIS — T86828 Other complications of skin graft (allograft) (autograft): Secondary | ICD-10-CM | POA: Diagnosis not present

## 2020-08-12 DIAGNOSIS — Y830 Surgical operation with transplant of whole organ as the cause of abnormal reaction of the patient, or of later complication, without mention of misadventure at the time of the procedure: Secondary | ICD-10-CM | POA: Diagnosis not present

## 2020-08-13 DIAGNOSIS — Y832 Surgical operation with anastomosis, bypass or graft as the cause of abnormal reaction of the patient, or of later complication, without mention of misadventure at the time of the procedure: Secondary | ICD-10-CM | POA: Diagnosis not present

## 2020-08-13 DIAGNOSIS — I96 Gangrene, not elsewhere classified: Secondary | ICD-10-CM | POA: Diagnosis not present

## 2020-08-13 DIAGNOSIS — T86828 Other complications of skin graft (allograft) (autograft): Secondary | ICD-10-CM | POA: Diagnosis not present

## 2020-08-13 DIAGNOSIS — G20 Parkinson's disease: Secondary | ICD-10-CM | POA: Diagnosis not present

## 2020-08-14 DIAGNOSIS — I739 Peripheral vascular disease, unspecified: Secondary | ICD-10-CM | POA: Diagnosis not present

## 2020-08-14 DIAGNOSIS — T86828 Other complications of skin graft (allograft) (autograft): Secondary | ICD-10-CM | POA: Diagnosis not present

## 2020-08-14 DIAGNOSIS — I96 Gangrene, not elsewhere classified: Secondary | ICD-10-CM | POA: Diagnosis not present

## 2020-08-14 DIAGNOSIS — Y830 Surgical operation with transplant of whole organ as the cause of abnormal reaction of the patient, or of later complication, without mention of misadventure at the time of the procedure: Secondary | ICD-10-CM | POA: Diagnosis not present

## 2020-08-18 DIAGNOSIS — M2032 Hallux varus (acquired), left foot: Secondary | ICD-10-CM | POA: Diagnosis not present

## 2020-08-25 DIAGNOSIS — Z4789 Encounter for other orthopedic aftercare: Secondary | ICD-10-CM | POA: Diagnosis not present

## 2020-09-03 ENCOUNTER — Telehealth: Payer: Self-pay | Admitting: Internal Medicine

## 2020-09-03 MED ORDER — ESTRADIOL 1 MG PO TABS
ORAL_TABLET | ORAL | 0 refills | Status: DC
Start: 2020-09-03 — End: 2020-10-03

## 2020-09-03 NOTE — Telephone Encounter (Signed)
[place] pharmacy was closed before she left and could not pick up her estradiol (ESTRACE) 1 MG tablet. She only has one more pill remaining and wanted to know if her medication could be sent to [facility] [address].

## 2020-09-11 DIAGNOSIS — Z981 Arthrodesis status: Secondary | ICD-10-CM | POA: Diagnosis not present

## 2020-09-11 DIAGNOSIS — M7732 Calcaneal spur, left foot: Secondary | ICD-10-CM | POA: Diagnosis not present

## 2020-09-11 DIAGNOSIS — M810 Age-related osteoporosis without current pathological fracture: Secondary | ICD-10-CM | POA: Diagnosis not present

## 2020-09-11 DIAGNOSIS — M7989 Other specified soft tissue disorders: Secondary | ICD-10-CM | POA: Diagnosis not present

## 2020-09-11 DIAGNOSIS — M2032 Hallux varus (acquired), left foot: Secondary | ICD-10-CM | POA: Diagnosis not present

## 2020-09-11 DIAGNOSIS — M205X2 Other deformities of toe(s) (acquired), left foot: Secondary | ICD-10-CM | POA: Diagnosis not present

## 2020-09-11 DIAGNOSIS — M7742 Metatarsalgia, left foot: Secondary | ICD-10-CM | POA: Diagnosis not present

## 2020-09-25 DIAGNOSIS — M2032 Hallux varus (acquired), left foot: Secondary | ICD-10-CM | POA: Diagnosis not present

## 2020-09-25 DIAGNOSIS — M7732 Calcaneal spur, left foot: Secondary | ICD-10-CM | POA: Diagnosis not present

## 2020-09-25 DIAGNOSIS — R600 Localized edema: Secondary | ICD-10-CM | POA: Diagnosis not present

## 2020-10-03 ENCOUNTER — Other Ambulatory Visit: Payer: Self-pay | Admitting: Internal Medicine

## 2020-10-06 DIAGNOSIS — H52223 Regular astigmatism, bilateral: Secondary | ICD-10-CM | POA: Diagnosis not present

## 2020-10-09 DIAGNOSIS — M2032 Hallux varus (acquired), left foot: Secondary | ICD-10-CM | POA: Diagnosis not present

## 2020-10-09 DIAGNOSIS — M7742 Metatarsalgia, left foot: Secondary | ICD-10-CM | POA: Diagnosis not present

## 2020-11-03 DIAGNOSIS — M7730 Calcaneal spur, unspecified foot: Secondary | ICD-10-CM | POA: Diagnosis not present

## 2020-11-03 DIAGNOSIS — Z981 Arthrodesis status: Secondary | ICD-10-CM | POA: Diagnosis not present

## 2020-11-03 DIAGNOSIS — M2032 Hallux varus (acquired), left foot: Secondary | ICD-10-CM | POA: Diagnosis not present

## 2020-11-03 DIAGNOSIS — Y838 Other surgical procedures as the cause of abnormal reaction of the patient, or of later complication, without mention of misadventure at the time of the procedure: Secondary | ICD-10-CM | POA: Diagnosis not present

## 2020-11-03 DIAGNOSIS — I739 Peripheral vascular disease, unspecified: Secondary | ICD-10-CM | POA: Diagnosis not present

## 2020-11-03 DIAGNOSIS — Z79899 Other long term (current) drug therapy: Secondary | ICD-10-CM | POA: Diagnosis not present

## 2020-11-03 DIAGNOSIS — M19072 Primary osteoarthritis, left ankle and foot: Secondary | ICD-10-CM | POA: Diagnosis not present

## 2020-11-03 DIAGNOSIS — T8189XD Other complications of procedures, not elsewhere classified, subsequent encounter: Secondary | ICD-10-CM | POA: Diagnosis not present

## 2020-11-03 DIAGNOSIS — G20 Parkinson's disease: Secondary | ICD-10-CM | POA: Diagnosis not present

## 2020-11-03 DIAGNOSIS — M2030 Hallux varus (acquired), unspecified foot: Secondary | ICD-10-CM | POA: Diagnosis not present

## 2020-11-04 ENCOUNTER — Other Ambulatory Visit: Payer: Self-pay | Admitting: Internal Medicine

## 2020-11-04 DIAGNOSIS — Z1231 Encounter for screening mammogram for malignant neoplasm of breast: Secondary | ICD-10-CM

## 2020-12-01 DIAGNOSIS — M7742 Metatarsalgia, left foot: Secondary | ICD-10-CM | POA: Diagnosis not present

## 2020-12-01 DIAGNOSIS — M205X2 Other deformities of toe(s) (acquired), left foot: Secondary | ICD-10-CM | POA: Diagnosis not present

## 2020-12-01 DIAGNOSIS — Z981 Arthrodesis status: Secondary | ICD-10-CM | POA: Diagnosis not present

## 2020-12-01 DIAGNOSIS — G20 Parkinson's disease: Secondary | ICD-10-CM | POA: Diagnosis not present

## 2020-12-01 DIAGNOSIS — M2032 Hallux varus (acquired), left foot: Secondary | ICD-10-CM | POA: Diagnosis not present

## 2020-12-23 ENCOUNTER — Ambulatory Visit
Admission: RE | Admit: 2020-12-23 | Discharge: 2020-12-23 | Disposition: A | Discharge status: Home / Self Care | Payer: BC Managed Care – PPO | Source: Ambulatory Visit | Attending: Internal Medicine | Admitting: Internal Medicine

## 2020-12-23 ENCOUNTER — Other Ambulatory Visit: Payer: Self-pay

## 2020-12-23 DIAGNOSIS — Z1231 Encounter for screening mammogram for malignant neoplasm of breast: Secondary | ICD-10-CM | POA: Diagnosis not present

## 2020-12-25 ENCOUNTER — Ambulatory Visit: Payer: BC Managed Care – PPO

## 2020-12-29 ENCOUNTER — Other Ambulatory Visit: Payer: Self-pay | Admitting: Internal Medicine

## 2021-01-12 DIAGNOSIS — M205X2 Other deformities of toe(s) (acquired), left foot: Secondary | ICD-10-CM | POA: Diagnosis not present

## 2021-01-12 DIAGNOSIS — M2032 Hallux varus (acquired), left foot: Secondary | ICD-10-CM | POA: Diagnosis not present

## 2021-01-12 DIAGNOSIS — M7742 Metatarsalgia, left foot: Secondary | ICD-10-CM | POA: Diagnosis not present

## 2021-01-12 DIAGNOSIS — T8189XD Other complications of procedures, not elsewhere classified, subsequent encounter: Secondary | ICD-10-CM | POA: Diagnosis not present

## 2021-01-13 ENCOUNTER — Telehealth: Payer: Self-pay | Admitting: Internal Medicine

## 2021-01-13 NOTE — Telephone Encounter (Signed)
PT is calling about her progesterone (PROMETRIUM) 200 MG capsule. Rx told her that it was needing approval.

## 2021-01-23 DIAGNOSIS — L600 Ingrowing nail: Secondary | ICD-10-CM | POA: Diagnosis not present

## 2021-01-23 DIAGNOSIS — L03032 Cellulitis of left toe: Secondary | ICD-10-CM | POA: Diagnosis not present

## 2021-03-18 DIAGNOSIS — M25572 Pain in left ankle and joints of left foot: Secondary | ICD-10-CM | POA: Diagnosis not present

## 2021-03-20 DIAGNOSIS — M25572 Pain in left ankle and joints of left foot: Secondary | ICD-10-CM | POA: Diagnosis not present

## 2021-03-24 DIAGNOSIS — M25572 Pain in left ankle and joints of left foot: Secondary | ICD-10-CM | POA: Diagnosis not present

## 2021-03-25 ENCOUNTER — Telehealth: Payer: Self-pay | Admitting: Internal Medicine

## 2021-03-25 NOTE — Telephone Encounter (Signed)
Patient ha snot been in had visit with PCP since 11/24/19 advised patient would need appointment. Patient staed she was at a hair appointment she would callback to schedule , please advise to refill.

## 2021-03-26 DIAGNOSIS — M25572 Pain in left ankle and joints of left foot: Secondary | ICD-10-CM | POA: Diagnosis not present

## 2021-03-30 DIAGNOSIS — M25572 Pain in left ankle and joints of left foot: Secondary | ICD-10-CM | POA: Diagnosis not present

## 2021-04-02 DIAGNOSIS — M25572 Pain in left ankle and joints of left foot: Secondary | ICD-10-CM | POA: Diagnosis not present

## 2021-04-07 DIAGNOSIS — M25572 Pain in left ankle and joints of left foot: Secondary | ICD-10-CM | POA: Diagnosis not present

## 2021-04-09 DIAGNOSIS — M25572 Pain in left ankle and joints of left foot: Secondary | ICD-10-CM | POA: Diagnosis not present

## 2021-04-13 DIAGNOSIS — M25572 Pain in left ankle and joints of left foot: Secondary | ICD-10-CM | POA: Diagnosis not present

## 2021-04-15 DIAGNOSIS — M25572 Pain in left ankle and joints of left foot: Secondary | ICD-10-CM | POA: Diagnosis not present

## 2021-04-22 DIAGNOSIS — M25572 Pain in left ankle and joints of left foot: Secondary | ICD-10-CM | POA: Diagnosis not present

## 2021-04-28 DIAGNOSIS — M25572 Pain in left ankle and joints of left foot: Secondary | ICD-10-CM | POA: Diagnosis not present

## 2021-04-28 MED ORDER — ESTRADIOL 1 MG PO TABS: ORAL_TABLET | ORAL | 0 refills | Status: DC

## 2021-04-28 NOTE — Addendum Note (Signed)
Addended by: Sherlene Shams on: 04/28/2021 05:23 PM   Modules accepted: Orders

## 2021-04-28 NOTE — Telephone Encounter (Signed)
PT called to request a refill or a 20 day supply to last her till her Sept 16 apt. She would like the refill of her estradiol (ESTRACE) 1 MG tablet to be sent to the [facility] on file. She states she only has 4 pills left.

## 2021-04-30 DIAGNOSIS — M25572 Pain in left ankle and joints of left foot: Secondary | ICD-10-CM | POA: Diagnosis not present

## 2021-05-12 DIAGNOSIS — M25572 Pain in left ankle and joints of left foot: Secondary | ICD-10-CM | POA: Diagnosis not present

## 2021-05-16 MED ORDER — PROGESTERONE 200 MG PO CAPS: ORAL_CAPSULE | ORAL | 3 refills | Status: DC

## 2021-05-16 MED ORDER — ESTRADIOL 1 MG PO TABS: ORAL_TABLET | ORAL | 0 refills | Status: DC

## 2021-05-16 NOTE — Telephone Encounter (Signed)
Medications refilled

## 2021-05-22 ENCOUNTER — Ambulatory Visit: Payer: BC Managed Care – PPO | Admitting: Internal Medicine

## 2021-05-25 DIAGNOSIS — Z981 Arthrodesis status: Secondary | ICD-10-CM | POA: Diagnosis not present

## 2021-05-25 DIAGNOSIS — M7742 Metatarsalgia, left foot: Secondary | ICD-10-CM | POA: Diagnosis not present

## 2021-05-25 DIAGNOSIS — G20 Parkinson's disease: Secondary | ICD-10-CM | POA: Diagnosis not present

## 2021-05-25 DIAGNOSIS — M7732 Calcaneal spur, left foot: Secondary | ICD-10-CM | POA: Diagnosis not present

## 2021-05-25 DIAGNOSIS — M2032 Hallux varus (acquired), left foot: Secondary | ICD-10-CM | POA: Diagnosis not present

## 2021-05-25 DIAGNOSIS — M19072 Primary osteoarthritis, left ankle and foot: Secondary | ICD-10-CM | POA: Diagnosis not present

## 2021-05-25 DIAGNOSIS — M205X2 Other deformities of toe(s) (acquired), left foot: Secondary | ICD-10-CM | POA: Diagnosis not present

## 2021-05-25 DIAGNOSIS — M79672 Pain in left foot: Secondary | ICD-10-CM | POA: Diagnosis not present

## 2021-05-25 DIAGNOSIS — I739 Peripheral vascular disease, unspecified: Secondary | ICD-10-CM | POA: Diagnosis not present

## 2021-05-28 ENCOUNTER — Telehealth: Payer: Self-pay | Admitting: Internal Medicine

## 2021-05-28 MED ORDER — ESTRADIOL 1 MG PO TABS: ORAL_TABLET | ORAL | 0 refills | Status: DC

## 2021-05-28 NOTE — Telephone Encounter (Signed)
Patient last seen PCP 11-19-19.

## 2021-05-28 NOTE — Telephone Encounter (Signed)
Patient has an appt on 06/19/21 but will need a refill on her estradiol (ESTRACE) 1 MG tablet before her October appointment.

## 2021-05-28 NOTE — Telephone Encounter (Signed)
Medication has been resent for patient.

## 2021-05-28 NOTE — Telephone Encounter (Signed)
Not according to her chart.  Please review estrogen was filled for 90 days on sept 6 .  If she was only given 30 days by pharmacy, ok to refill estrogen and progesterone

## 2021-06-19 ENCOUNTER — Ambulatory Visit: Payer: BC Managed Care – PPO | Admitting: Internal Medicine

## 2021-06-19 ENCOUNTER — Telehealth: Payer: Self-pay | Admitting: Internal Medicine

## 2021-06-19 NOTE — Telephone Encounter (Signed)
Patient had to cancel her appointment today. She has been rescheduled for November. She is requesting a refill on her  estradiol (ESTRACE) 1 MG tablet.

## 2021-06-19 NOTE — Telephone Encounter (Signed)
I called the patient and informed her that she has refills after I called the pharmacy and it is to soon to refill and she understood.  I gave her the date of the 06/27/2021.  Vanessia Bokhari,cma

## 2021-07-08 ENCOUNTER — Ambulatory Visit: Payer: BC Managed Care – PPO | Admitting: Internal Medicine

## 2021-07-19 IMAGING — MG DIGITAL SCREENING BILAT W/ TOMO W/ CAD
8 series · 9 of 24 positions shown · non-contrast
Comparison: Previous exam(s).

CLINICAL DATA: Screening.

EXAM:
DIGITAL SCREENING BILATERAL MAMMOGRAM WITH TOMO AND CAD

[L CC synth-2D]
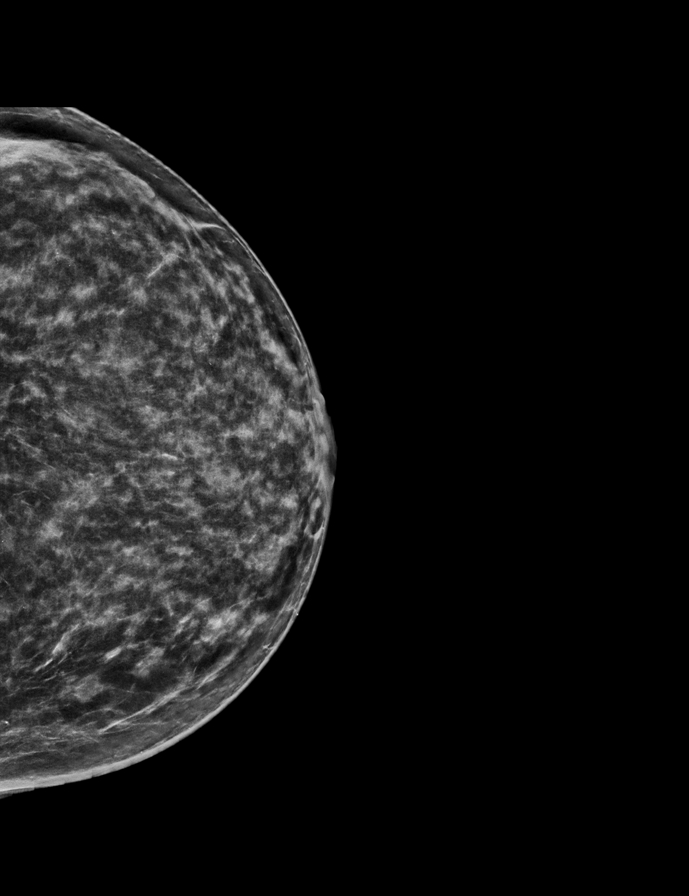

[L MLO synth-2D]
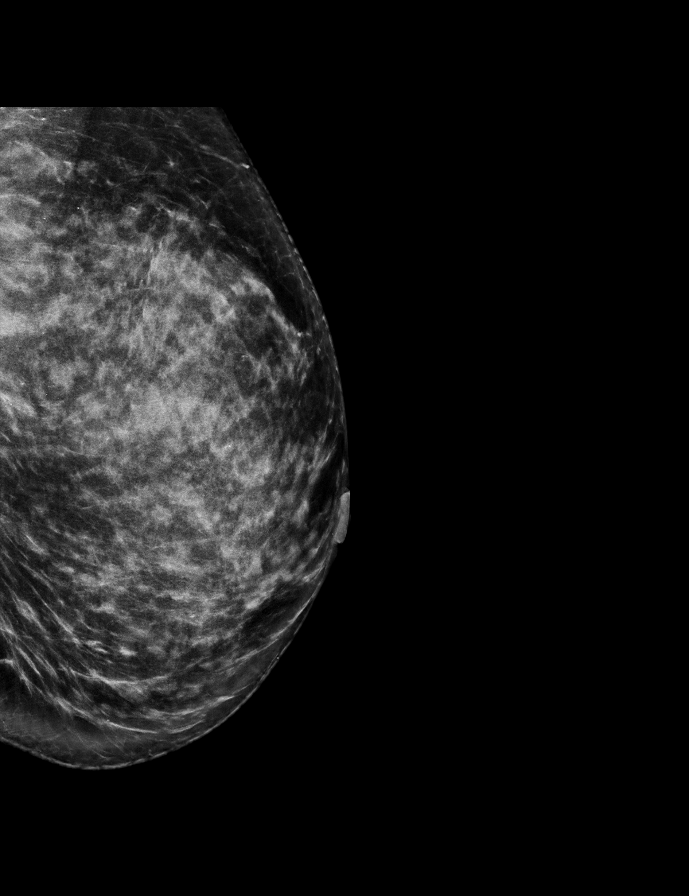

[R MLO synth-2D]
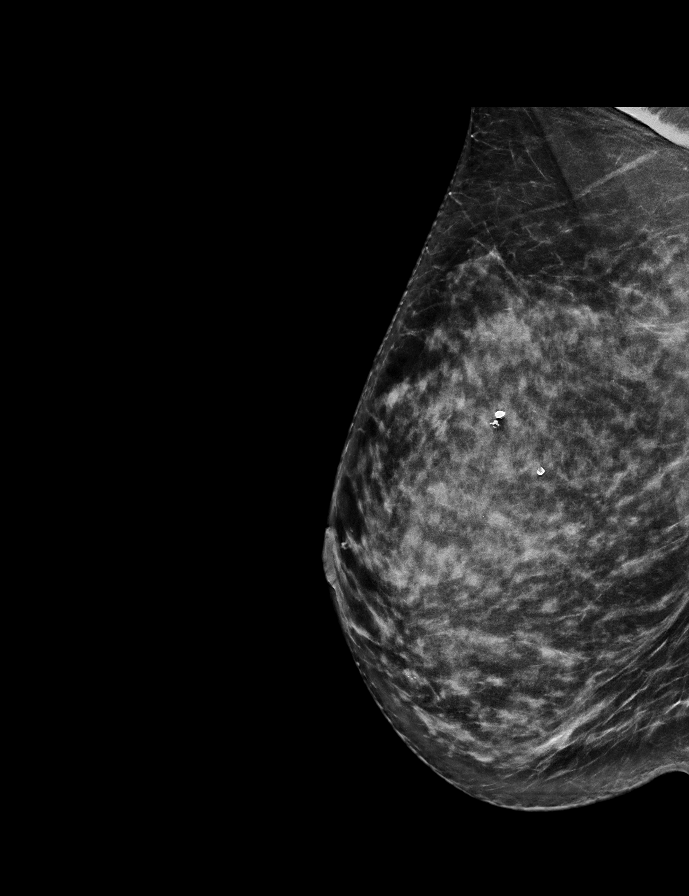

[R CC synth-2D]
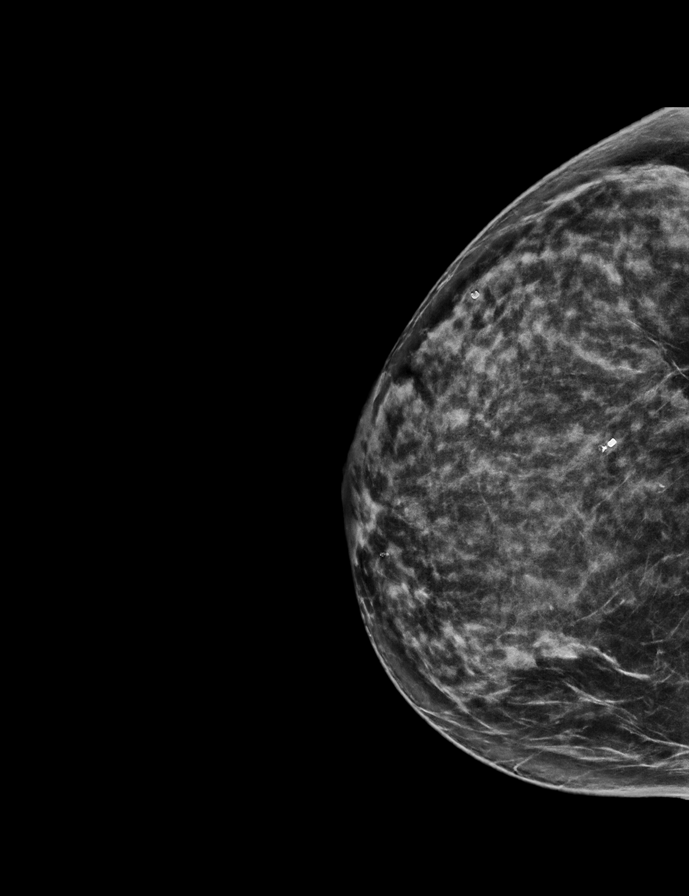

[L CC tomo · 2 of 65 frames shown]
[frame 21/65]
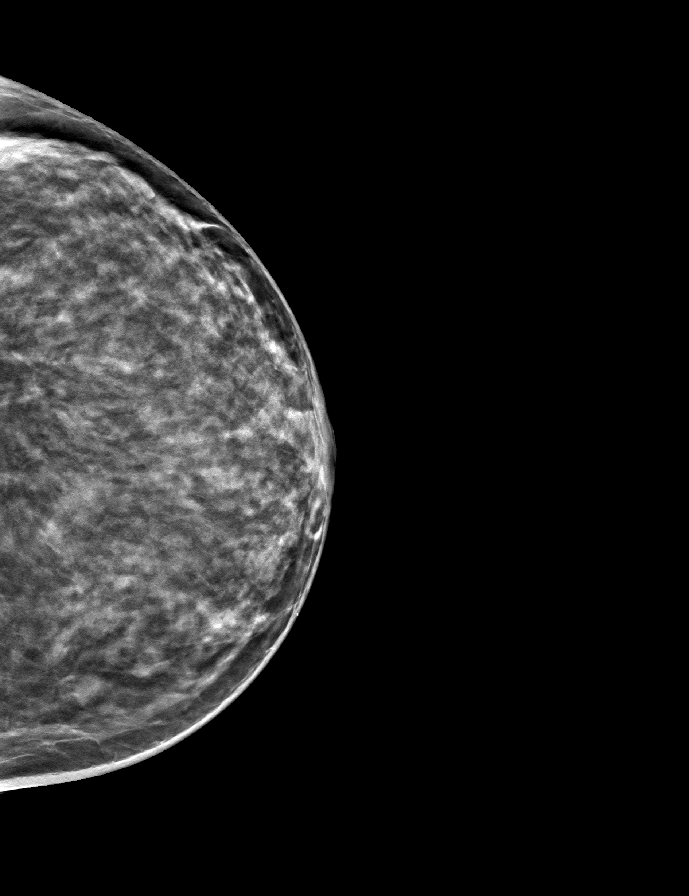
[frame 33/65]
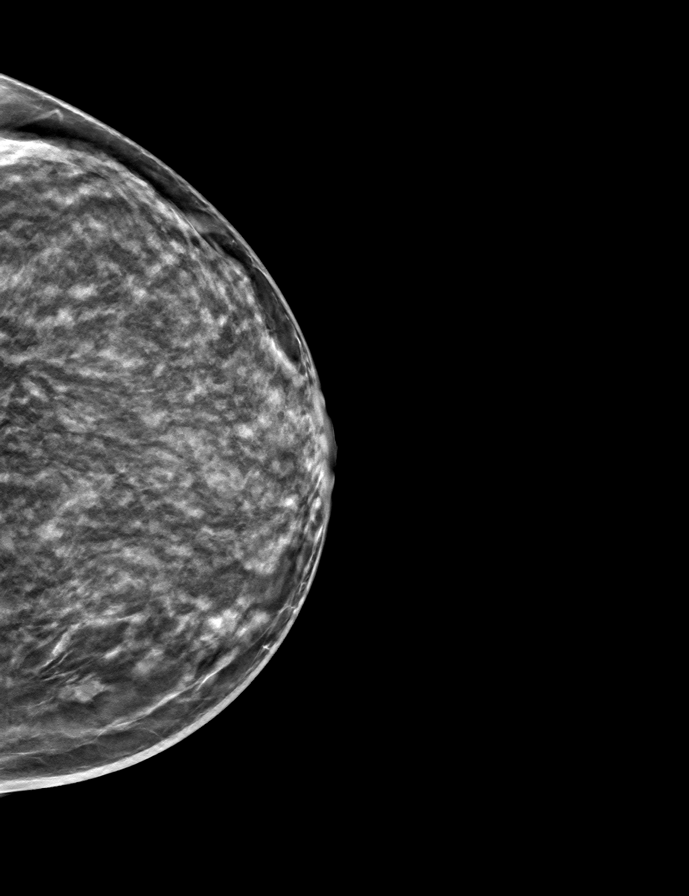

[R MLO tomo · tomo slice 35/69.0]
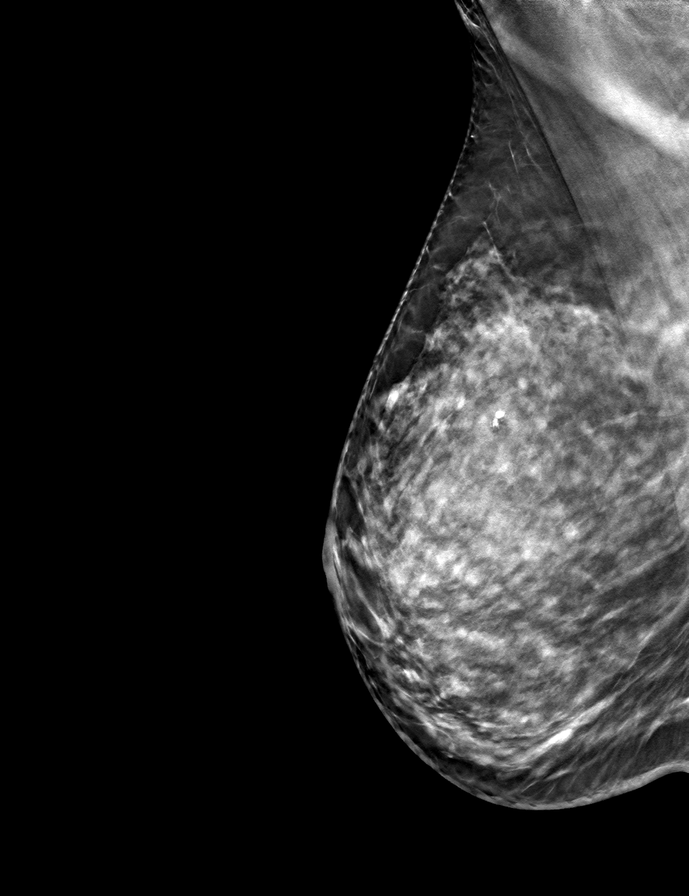

[R CC tomo · tomo slice 32/63.0]
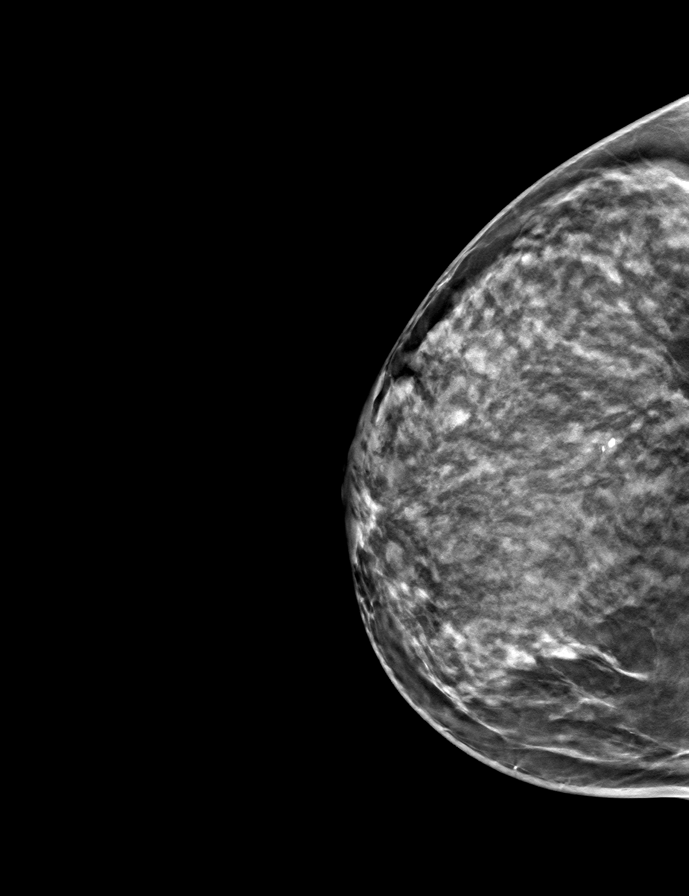

[L MLO tomo · tomo slice 36/71.0]
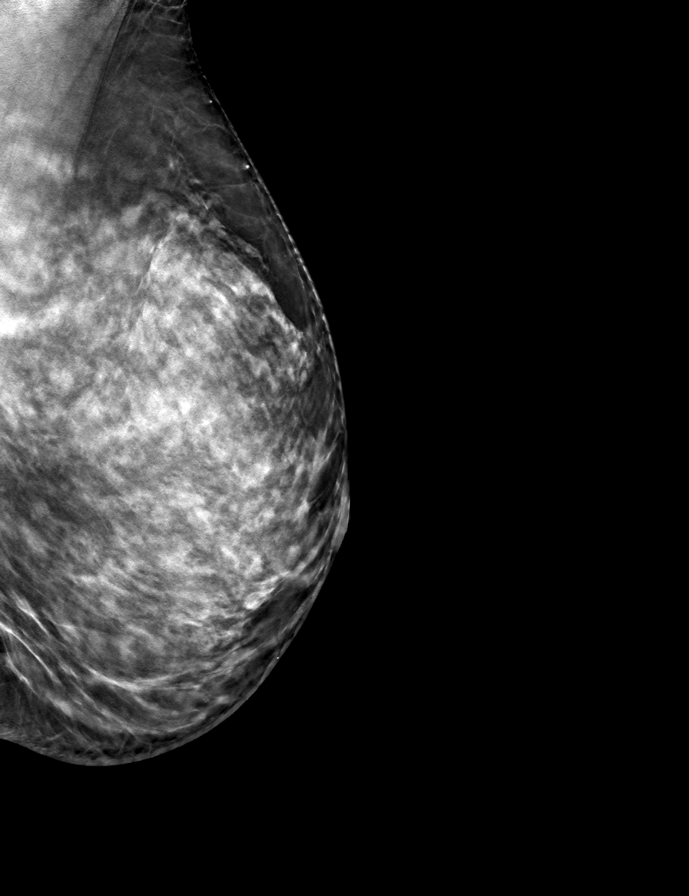

[9 of 24 positions shown; findings below may reference images not displayed]

ACR Breast Density Category c: The breast tissue is heterogeneously
dense, which may obscure small masses.
FINDINGS: In the right breast, a possible asymmetry warrants further
evaluation. In the left breast, no findings suspicious for
malignancy. Images were processed with CAD.
IMPRESSION: Further evaluation is suggested for possible asymmetry in the right
breast.

RECOMMENDATION:
Diagnostic mammogram and possibly ultrasound of the right breast.
(Code:EU-[id number])

The patient will be contacted regarding the findings, and additional
imaging will be scheduled.

BI-RADS CATEGORY  0: Incomplete. Need additional imaging evaluation
and/or prior mammograms for comparison.

## 2021-08-04 DIAGNOSIS — M25572 Pain in left ankle and joints of left foot: Secondary | ICD-10-CM | POA: Diagnosis not present

## 2021-08-04 DIAGNOSIS — R262 Difficulty in walking, not elsewhere classified: Secondary | ICD-10-CM | POA: Diagnosis not present

## 2021-08-12 ENCOUNTER — Other Ambulatory Visit: Payer: Self-pay

## 2021-08-12 ENCOUNTER — Encounter: Payer: Self-pay | Admitting: Internal Medicine

## 2021-08-12 ENCOUNTER — Ambulatory Visit (INDEPENDENT_AMBULATORY_CARE_PROVIDER_SITE_OTHER): Payer: BC Managed Care – PPO | Admitting: Internal Medicine

## 2021-08-12 VITALS — BP 108/72 | HR 65 | Temp 96.2°F | Ht 66.0 in | Wt 138.2 lb

## 2021-08-12 DIAGNOSIS — E785 Hyperlipidemia, unspecified: Secondary | ICD-10-CM | POA: Diagnosis not present

## 2021-08-12 DIAGNOSIS — R7301 Impaired fasting glucose: Secondary | ICD-10-CM | POA: Diagnosis not present

## 2021-08-12 DIAGNOSIS — G20 Parkinson's disease: Secondary | ICD-10-CM | POA: Diagnosis not present

## 2021-08-12 DIAGNOSIS — G20A1 Parkinson's disease without dyskinesia, without mention of fluctuations: Secondary | ICD-10-CM

## 2021-08-12 DIAGNOSIS — R262 Difficulty in walking, not elsewhere classified: Secondary | ICD-10-CM | POA: Diagnosis not present

## 2021-08-12 DIAGNOSIS — M25572 Pain in left ankle and joints of left foot: Secondary | ICD-10-CM | POA: Diagnosis not present

## 2021-08-12 DIAGNOSIS — Z0000 Encounter for general adult medical examination without abnormal findings: Secondary | ICD-10-CM

## 2021-08-12 DIAGNOSIS — M79605 Pain in left leg: Secondary | ICD-10-CM | POA: Diagnosis not present

## 2021-08-12 MED ORDER — OMEPRAZOLE 20 MG PO CPDR: 20.0000 mg | DELAYED_RELEASE_CAPSULE | Freq: Every day | ORAL | 1 refills | Status: DC

## 2021-08-12 MED ORDER — MELOXICAM 15 MG PO TABS: 15.0000 mg | ORAL_TABLET | Freq: Every day | ORAL | 0 refills | Status: DC

## 2021-08-12 NOTE — Progress Notes (Signed)
Patient ID: Nancy Villegas, female    DOB: 12-27-1960  Age: 60 y.o. MRN: [id number]  The patient is here for annual preventive examination and management of other chronic and acute problems.   The risk factors are reflected in the social history.  The roster of all physicians providing medical care to patient - is listed in the Snapshot section of the chart.  Activities of daily living:  The patient is 100% independent in all ADLs: dressing, toileting, feeding as well as independent mobility  Home safety : The patient has smoke detectors in the home. They wear seatbelts.  There are no firearms at home. There is no violence in the home.   There is no risks for hepatitis, STDs or HIV. There is no   history of blood transfusion. They have no travel history to infectious disease endemic areas of the world.  The patient has seen their dentist in the last six month. They have seen their eye doctor in the last year. They admit to slight hearing difficulty with regard to whispered voices and some television programs.  They have deferred audiologic testing in the last year.  They do not  have excessive sun exposure. Discussed the need for sun protection: hats, long sleeves and use of sunscreen if there is significant sun exposure.   Diet: the importance of a healthy diet is discussed. They do have a healthy diet.  The benefits of regular aerobic exercise were discussed. She walks 4 times per week ,  20 minutes.   Depression screen: there are no signs or vegative symptoms of depression- irritability, change in appetite, anhedonia, sadness/tearfullness.  Cognitive assessment: the patient manages all their financial and personal affairs and is actively engaged. They could relate day,date,year and events; recalled 2/3 objects at 3 minutes; performed clock-face test normally.  The following portions of the patient's history were reviewed and updated as appropriate: allergies, current medications, past  family history, past medical history,  past surgical history, past social history  and problem list.  Visual acuity was not assessed per patient preference since she has regular follow up with her ophthalmologist. Hearing and body mass index were assessed and reviewed.   During the course of the visit the patient was educated and counseled about appropriate screening and preventive services including : fall prevention , diabetes screening, nutrition counseling, colorectal cancer screening, and recommended immunizations.    CC: The primary encounter diagnosis was Hyperlipidemia LDL goal <160. Diagnoses of Impaired fasting glucose, Encounter for preventive health examination, Parkinson's disease ([place]), and Leg pain, diffuse, left were also pertinent to this visit.  1) left ankle pain and limited mobility post 2nd surgery NOV 8 by Clair Gulling at [facility] (1st by [facility] complicated by infection ) has chronic pain with just walking dicussed treatment iwt mobic and tylenol going to PT fo rhtepast2 years   2) PD:  diagnosed 3 years ago. All symptoms localized to left side.  has not told family  yet.  Exercising vigorously but no longer running due to ankle surgeries  3) Menopause wants to stop HRT because she is afraid it will cause breast CA . Hot flashes mostly at night    History Hadlei has a past medical history of Heart murmur, History of chicken pox, and Urine, incontinence, stress female.   She has a past surgical history that includes No past surgeries.   Her family history includes COPD in her father; Cancer in her father; Diabetes in her father; Healthy in her  brother, child, and sister; Leukemia in her mother; Parkinsonism (age of onset: 50) in her mother; Ulcerative colitis in her mother.She reports that she has never smoked. She has never used smokeless tobacco. She reports current alcohol use. She reports that she does not use drugs.  Outpatient Medications Prior to Visit  Medication Sig  Dispense Refill   carbidopa-levodopa (SINEMET IR) 25-100 MG tablet Take by mouth.     estradiol (ESTRACE) 1 MG tablet TAKE 1 TABLET(1 MG) BY MOUTH DAILY 90 tablet 0   progesterone (PROMETRIUM) 200 MG capsule TAKE 1 CAPSULE BY MOUTH DAILY AT NIGHT FOR 12 DAYS PER MONTH 36 capsule 3   azithromycin (ZITHROMAX) 250 MG tablet Take 2 tablets ( total 500 mg) PO on day 1, then take 1 tablet ( total 250 mg) by mouth q24h x 4 days. 6 tablet 0   benzonatate (TESSALON PERLES) 100 MG capsule Take 2 capsules (200 mg total) by mouth 3 (three) times daily as needed for cough. 20 capsule 0   No facility-administered medications prior to visit.    Review of Systems  Patient denies headache, fevers, malaise, unintentional weight loss, skin rash, eye pain, sinus congestion and sinus pain, sore throat, dysphagia,  hemoptysis , cough, dyspnea, wheezing, chest pain, palpitations, orthopnea, edema, abdominal pain, nausea, melena, diarrhea, constipation, flank pain, dysuria, hematuria, urinary  Frequency, nocturia, numbness, tingling, seizures,  Focal weakness, Loss of consciousness,  Tremor, insomnia, depression, anxiety, and suicidal ideation.     Objective:  BP 108/72 (BP Location: Left Arm, Patient Position: Sitting, Cuff Size: Normal)   Pulse 65   Temp (!) 96.2 F (35.7 C) (Temporal)   Ht 5' 6"  (1.676 m)   Wt 138 lb 3.2 oz (62.7 kg)   SpO2 97%   BMI 22.31 kg/m   Physical Exam  General appearance: alert, cooperative and appears stated age Ears: normal TM's and external ear canals both ears Throat: lips, mucosa, and tongue normal; teeth and gums normal Neck: no adenopathy, no carotid bruit, supple, symmetrical, trachea midline and thyroid not enlarged, symmetric, no tenderness/mass/nodules Back: symmetric, no curvature. ROM normal. No CVA tenderness. Lungs: clear to auscultation bilaterally Heart: regular rate and rhythm, S1, S2 normal, no murmur, click, rub or gallop Abdomen: soft, non-tender; bowel  sounds normal; no masses,  no organomegaly Pulses: 2+ and symmetric Skin: Skin color, texture, turgor normal. No rashes or lesions Lymph nodes: Cervical, supraclavicular, and axillary nodes normal.    Assessment & Plan:   Problem List Items Addressed This Visit     Parkinson's disease ([place])    Diagnosis confirmed by second opinion (Dr Vernia Buff,  [facility], 2019)  Taking Sinemet reduced daytime dose due to sedation. Has not told her children yet .  Exercising vigorously       Leg pain, diffuse, left    Secondary to multiple ankle surgeries.  Pain management with tylenol, NSAID       Encounter for preventive health examination    age appropriate education and counseling updated, referrals for preventative services and immunizations addressed, dietary and smoking counseling addressed, most recent labs reviewed.  I have personally reviewed and have noted:   1) the patient's medical and social history 2) The pt's use of alcohol, tobacco, and illicit drugs 3) The patient's current medications and supplements 4) Functional ability including ADL's, fall risk, home safety risk, hearing and visual impairment 5) Diet and physical activities 6) Evidence for depression or mood disorder 7) The patient's height, weight, and BMI have  been recorded in the chart   I have made referrals, and provided counseling and education based on review of the above      Other Visit Diagnoses     Hyperlipidemia LDL goal <160    -  Primary   Relevant Orders   Lipid Profile   Comp Met (CMET)   Impaired fasting glucose       Relevant Orders   HgB A1c       I have discontinued Kristianna H. [place]'s benzonatate and azithromycin. I am also having her start on meloxicam and omeprazole. Additionally, I am having her maintain her carbidopa-levodopa, progesterone, and estradiol.  Meds ordered this encounter  Medications   meloxicam (MOBIC) 15 MG tablet    Sig: Take 1 tablet (15 mg total) by mouth daily.     Dispense:  90 tablet    Refill:  0   omeprazole (PRILOSEC) 20 MG capsule    Sig: Take 1 capsule (20 mg total) by mouth daily.    Dispense:  90 capsule    Refill:  1    Medications Discontinued During This Encounter  Medication Reason   azithromycin (ZITHROMAX) 250 MG tablet    benzonatate (TESSALON PERLES) 100 MG capsule     Follow-up: No follow-ups on file.   Crecencio Mc, MD

## 2021-08-12 NOTE — Patient Instructions (Addendum)
Good to see you!   Here's what we discussed   Estradiol wean:  Take 1/2 tablet daily for  2 weeks,  ( =  7 tablets)   Then take 1/2 tablet daily every other day for  FOR 2 WEEKS,  (=  3 TABLETS )   THEN STOP  (should only take 10 pills to complete the taper )    For the foot pain:  Mobic (meloxicam ) 15 mg daily OR IBUPROFEN  800 MG DAILY.   (NSAIDs)whichever works better   [place] can add 1000 mg tylenol every 12 hours   if needed  for additional pain control    Omeprazole to protect stomach from gastritis  that can be caused by NSAIDS

## 2021-08-13 LAB — COMPREHENSIVE METABOLIC PANEL
ALT: 12 U/L (ref 0–35)
AST: 15 U/L (ref 0–37)
Albumin: 4.2 g/dL (ref 3.5–5.2)
Alkaline Phosphatase: 50 U/L (ref 39–117)
BUN: 21 mg/dL (ref 6–23)
CO2: 27 mEq/L (ref 19–32)
Calcium: 9.6 mg/dL (ref 8.4–10.5)
Chloride: 101 mEq/L (ref 96–112)
Creatinine, Ser: 0.51 mg/dL (ref 0.40–1.20)
GFR: 102.05 mL/min (ref >60.00)
Glucose, Bld: 85 mg/dL (ref 70–99)
Potassium: 3.6 mEq/L (ref 3.5–5.1)
Sodium: 136 mEq/L (ref 135–145)
Total Bilirubin: 0.3 mg/dL (ref 0.2–1.2)
Total Protein: 6.7 g/dL (ref 6.0–8.3)

## 2021-08-13 LAB — LIPID PANEL
Cholesterol: 208 mg/dL — ABNORMAL HIGH (ref 0–200)
HDL: 113.5 mg/dL (ref >39.00)
LDL Cholesterol: 82 mg/dL (ref 0–99)
NonHDL: 94.79
Total CHOL/HDL Ratio: 2
Triglycerides: 65 mg/dL (ref 0.0–149.0)
VLDL: 13 mg/dL (ref 0.0–40.0)

## 2021-08-13 LAB — HEMOGLOBIN A1C: Hgb A1c MFr Bld: 5.5 % (ref 4.6–6.5)

## 2021-08-13 NOTE — Assessment & Plan Note (Signed)
Secondary to multiple ankle surgeries.  Pain management with tylenol, NSAID

## 2021-08-13 NOTE — Assessment & Plan Note (Signed)
Diagnosis confirmed by second opinion (Dr Quinn Plowman,  [facility], 2019)  Taking Sinemet reduced daytime dose due to sedation. Has not told her children yet .  Exercising vigorously

## 2021-08-13 NOTE — Assessment & Plan Note (Signed)

## 2021-08-14 DIAGNOSIS — M25572 Pain in left ankle and joints of left foot: Secondary | ICD-10-CM | POA: Diagnosis not present

## 2021-08-14 DIAGNOSIS — R262 Difficulty in walking, not elsewhere classified: Secondary | ICD-10-CM | POA: Diagnosis not present

## 2021-08-19 DIAGNOSIS — R262 Difficulty in walking, not elsewhere classified: Secondary | ICD-10-CM | POA: Diagnosis not present

## 2021-08-19 DIAGNOSIS — M25572 Pain in left ankle and joints of left foot: Secondary | ICD-10-CM | POA: Diagnosis not present

## 2021-08-25 DIAGNOSIS — M25572 Pain in left ankle and joints of left foot: Secondary | ICD-10-CM | POA: Diagnosis not present

## 2021-08-25 DIAGNOSIS — R262 Difficulty in walking, not elsewhere classified: Secondary | ICD-10-CM | POA: Diagnosis not present

## 2021-08-27 DIAGNOSIS — R262 Difficulty in walking, not elsewhere classified: Secondary | ICD-10-CM | POA: Diagnosis not present

## 2021-08-27 DIAGNOSIS — M25572 Pain in left ankle and joints of left foot: Secondary | ICD-10-CM | POA: Diagnosis not present

## 2021-09-23 DIAGNOSIS — R262 Difficulty in walking, not elsewhere classified: Secondary | ICD-10-CM | POA: Diagnosis not present

## 2021-09-23 DIAGNOSIS — M25572 Pain in left ankle and joints of left foot: Secondary | ICD-10-CM | POA: Diagnosis not present

## 2021-11-26 ENCOUNTER — Encounter: Payer: Self-pay | Admitting: Internal Medicine

## 2021-12-01 DIAGNOSIS — L03032 Cellulitis of left toe: Secondary | ICD-10-CM | POA: Diagnosis not present

## 2021-12-02 ENCOUNTER — Encounter: Payer: Self-pay | Admitting: Internal Medicine

## 2021-12-02 ENCOUNTER — Ambulatory Visit (INDEPENDENT_AMBULATORY_CARE_PROVIDER_SITE_OTHER): Payer: BC Managed Care – PPO | Admitting: Internal Medicine

## 2021-12-02 VITALS — BP 128/70 | HR 65 | Temp 98.0°F | Ht 66.0 in | Wt 141.2 lb

## 2021-12-02 DIAGNOSIS — N951 Menopausal and female climacteric states: Secondary | ICD-10-CM | POA: Diagnosis not present

## 2021-12-02 DIAGNOSIS — G20 Parkinson's disease: Secondary | ICD-10-CM | POA: Diagnosis not present

## 2021-12-02 DIAGNOSIS — Z1231 Encounter for screening mammogram for malignant neoplasm of breast: Secondary | ICD-10-CM

## 2021-12-02 MED ORDER — ESTRADIOL 1 MG PO TABS: ORAL_TABLET | ORAL | 0 refills | Status: DC

## 2021-12-02 MED ORDER — PROGESTERONE 200 MG PO CAPS: ORAL_CAPSULE | ORAL | 3 refills | Status: DC

## 2021-12-02 NOTE — Assessment & Plan Note (Addendum)
Stiffness is improving. She is playing pickle ball and working out at [employer] Prefers for family (daughters) to remain ignorant of her disease thus far so she is avoiding "rock steady' proram.    Has developed a slight tremor of left hand and left ankle  Reviewed use of sinemet, use is limited by sedating effects.  Current taking 2 in am 1 in afternoon,  2 at bedtime  Recommend changing bedtime dose to 1 at dinner ,  1 at bedtime  ?

## 2021-12-02 NOTE — Progress Notes (Signed)
? ?Subjective:  ?Patient ID: Nancy Villegas, female    DOB: 02/09/1961  Age: 61 y.o. MRN: [id number] ? ?CC: The primary encounter diagnosis was Encounter for screening mammogram for malignant neoplasm of breast. Diagnoses of Menopausal hot flushes and Parkinson's disease ([place]) were also pertinent to this visit. ? ? ?This visit occurred during the SARS-CoV-2 public health emergency.  Safety protocols were in place, including screening questions prior to the visit, additional usage of staff PPE, and extensive cleaning of exam room while observing appropriate contact time as indicated for disinfecting solutions.   ? ?HPI ?Nancy Villegas presents for  ?Chief Complaint  ?Patient presents with  ? Follow-up  ?  Pt c/o hot flashes - pt reports waking up every hour at night drenched. Is not getting sleep. Pt was on estrogen and progesterone for 9 years. Has stopped taking.  ? ?1) menopause: hot flashes at night have returned with a vengeance since stopping HRT and are described as unbearable due to sweating her pajamas and sleep interruption.  Making her moody,  irritable  no sleep ? ?2) PD:  reviewed regimen , symptoms.  Staying physically active.  Stiffness improving.  Has developed a  New tremor of left hand and left ankle . Still hiding her diagnosis from her children and friends .   Reviewed her dosing regimen  ? ? ?Outpatient Medications Prior to Visit  ?Medication Sig Dispense Refill  ? carbidopa-levodopa (SINEMET IR) 25-100 MG tablet Take by mouth.    ? estradiol (ESTRACE) 1 MG tablet TAKE 1 TABLET(1 MG) BY MOUTH DAILY (Patient not taking: Reported on 12/02/2021) 90 tablet 0  ? meloxicam (MOBIC) 15 MG tablet Take 1 tablet (15 mg total) by mouth daily. 90 tablet 0  ? omeprazole (PRILOSEC) 20 MG capsule Take 1 capsule (20 mg total) by mouth daily. 90 capsule 1  ? progesterone (PROMETRIUM) 200 MG capsule TAKE 1 CAPSULE BY MOUTH DAILY AT NIGHT FOR 12 DAYS PER MONTH (Patient not taking: Reported on 12/02/2021) 36  capsule 3  ? ?No facility-administered medications prior to visit.  ? ? ?Review of Systems; ? ?Patient denies headache, fevers, malaise, unintentional weight loss, skin rash, eye pain, sinus congestion and sinus pain, sore throat, dysphagia,  hemoptysis , cough, dyspnea, wheezing, chest pain, palpitations, orthopnea, edema, abdominal pain, nausea, melena, diarrhea, constipation, flank pain, dysuria, hematuria, urinary  Frequency, nocturia, numbness, tingling, seizures,  Focal weakness, Loss of consciousness,  Tremor, insomnia, depression, anxiety, and suicidal ideation.   ? ? ? ?Objective:  ?BP 128/70 (BP Location: Left Arm, Patient Position: Sitting, Cuff Size: Small)   Pulse 65   Temp 98 ?F (36.7 ?C) (Oral)   Ht 5\' 6"  (1.676 m)   Wt 141 lb 3.2 oz (64 kg)   SpO2 98%   BMI 22.79 kg/m?  ? ?BP Readings from Last 3 Encounters:  ?12/02/21 128/70  ?08/12/21 108/72  ?11/19/19 128/82  ? ? ?Wt Readings from Last 3 Encounters:  ?12/02/21 141 lb 3.2 oz (64 kg)  ?08/12/21 138 lb 3.2 oz (62.7 kg)  ?07/11/20 134 lb (60.8 kg)  ? ? ?General appearance: alert, cooperative and appears stated age ?Ears: normal TM's and external ear canals both ears ?Throat: lips, mucosa, and tongue normal; teeth and gums normal ?Neck: no adenopathy, no carotid bruit, supple, symmetrical, trachea midline and thyroid not enlarged, symmetric, no tenderness/mass/nodules ?Back: symmetric, no curvature. ROM normal. No CVA tenderness. ?Lungs: clear to auscultation bilaterally ?Heart: regular rate and rhythm, S1, S2 normal, no murmur,  click, rub or gallop ?Abdomen: soft, non-tender; bowel sounds normal; no masses,  no organomegaly ?Pulses: 2+ and symmetric ?Skin: Skin color, texture, turgor normal. No rashes or lesions ?Lymph nodes: Cervical, supraclavicular, and axillary nodes normal. ?Neuro:  awake and interactive with normal mood and affect. Higher cortical functions are normal. Speech is clear without word-finding difficulty or dysarthria.  Extraocular movements are intact. Visual fields of both eyes are grossly intact. Sensation to light touch is grossly intact bilaterally of upper and lower extremities. Motor examination shows 4+/5 symmetric hand grip and upper extremity and 5/5 lower extremity strength. There is no pronation or drift. Gait is non-ataxic .  Mild left hand resting  tremor  ? ?Lab Results  ?Component Value Date  ? HGBA1C 5.5 08/12/2021  ? ? ?Lab Results  ?Component Value Date  ? CREATININE 0.51 08/12/2021  ? CREATININE 0.53 11/19/2019  ? CREATININE 0.61 08/02/2018  ? ? ?Lab Results  ?Component Value Date  ? WBC 5.9 08/02/2018  ? HGB 12.4 08/02/2018  ? HCT 36.0 08/02/2018  ? PLT 239.0 08/02/2018  ? GLUCOSE 85 08/12/2021  ? CHOL 208 (H) 08/12/2021  ? TRIG 65.0 08/12/2021  ? HDL 113.50 08/12/2021  ? [place] 82 08/12/2021  ? ALT 12 08/12/2021  ? AST 15 08/12/2021  ? NA 136 08/12/2021  ? K 3.6 08/12/2021  ? CL 101 08/12/2021  ? CREATININE 0.51 08/12/2021  ? BUN 21 08/12/2021  ? CO2 27 08/12/2021  ? TSH 0.82 11/19/2019  ? HGBA1C 5.5 08/12/2021  ? ? ?MM 3D SCREEN BREAST BILATERAL ? ?Result Date: 12/23/2020 ?CLINICAL DATA:  Screening. EXAM: DIGITAL SCREENING BILATERAL MAMMOGRAM WITH TOMOSYNTHESIS AND CAD TECHNIQUE: Bilateral screening digital craniocaudal and mediolateral oblique mammograms were obtained. Bilateral screening digital breast tomosynthesis was performed. The images were evaluated with computer-aided detection. COMPARISON:  Previous exam(s). ACR Breast Density Category d: The breast tissue is extremely dense, which lowers the sensitivity of mammography FINDINGS: There are no findings suspicious for malignancy. The images were evaluated with computer-aided detection. IMPRESSION: No mammographic evidence of malignancy. A result letter of this screening mammogram will be mailed directly to the patient. RECOMMENDATION: Screening mammogram in one year. (Code:SM-B-01Y) BI-RADS CATEGORY  1: Negative. Electronically Signed   By: Evangeline Dakin M.D.   On: 12/23/2020 16:22  ? ? ?Assessment & Plan:  ? ?Problem List Items Addressed This Visit   ? ? Parkinson's disease ([place])  ?  Stiffness is improving. She is playing pickle ball and working out at [employer]. Prefers for family (daughters) to remain ignorant of her disease thus far so she is avoiding "rock steady' proram.    Has developed a slight tremor of left hand and left ankle  Reviewed use of sinemet, use is limited by sedating effects.  Current taking 2 in am 1 in afternoon,  2 at bedtime  Recommend changing bedtime dose to 1 at dinner ,  1 at bedtime  ?  ?  ? Menopausal hot flushes  ?  Resuming HRT for symptoms that are significantly impacting her sleep and QOL ?  ?  ? ?Other Visit Diagnoses   ? ? Encounter for screening mammogram for malignant neoplasm of breast    -  Primary  ? Relevant Orders  ? MM 3D SCREEN BREAST BILATERAL  ? ?  ? ? ?I spent  4 0 minutes dedicated to the care of this patient on the date of this encounter to include pre-visit review of patient's medical history,  most recent  office visits with me and patient's specialists,  most recent ER visit/hospitalization, EKG, imaging studies, Face-to-face time with the patient , and post visit ordering of testing and therapeutics.   ? ?Follow-up: No follow-ups on file. ? ? ?Crecencio Mc, MD ?

## 2021-12-02 NOTE — Assessment & Plan Note (Signed)
Resuming HRT for symptoms that are significantly impacting her sleep and QOL ?

## 2021-12-02 NOTE — Patient Instructions (Signed)
I agree with resuming estrogen/progesterone and have sent the rx to pharmacy ? ? ?Try scheduling  your carbidopa as follows: ? ?2 at 5 am.   ?1 at noon ?1 at 6 pm ?1 at bedtime ? ? ?Your symptoms should be  your guide to any future adjustments  ? ? ?If you would like a GYN referral ,  just let me know. I'm happy to do your PAPs or let GYN do them, whatever you prefer  ?

## 2021-12-07 DIAGNOSIS — G20 Parkinson's disease: Secondary | ICD-10-CM | POA: Diagnosis not present

## 2021-12-24 DIAGNOSIS — L308 Other specified dermatitis: Secondary | ICD-10-CM | POA: Diagnosis not present

## 2021-12-30 ENCOUNTER — Encounter: Payer: Self-pay | Admitting: Internal Medicine

## 2021-12-31 ENCOUNTER — Ambulatory Visit
Admission: RE | Admit: 2021-12-31 | Discharge: 2021-12-31 | Disposition: A | Discharge status: Home / Self Care | Payer: BC Managed Care – PPO | Source: Ambulatory Visit | Attending: Internal Medicine | Admitting: Internal Medicine

## 2021-12-31 DIAGNOSIS — Z1231 Encounter for screening mammogram for malignant neoplasm of breast: Secondary | ICD-10-CM

## 2021-12-31 NOTE — Telephone Encounter (Signed)
I tried to call patient to offer appointment but VM was full. ?

## 2022-01-01 ENCOUNTER — Encounter: Payer: Self-pay | Admitting: Internal Medicine

## 2022-01-01 ENCOUNTER — Other Ambulatory Visit: Payer: Self-pay | Admitting: Internal Medicine

## 2022-01-01 ENCOUNTER — Telehealth (INDEPENDENT_AMBULATORY_CARE_PROVIDER_SITE_OTHER): Payer: BC Managed Care – PPO | Admitting: Internal Medicine

## 2022-01-01 DIAGNOSIS — R928 Other abnormal and inconclusive findings on diagnostic imaging of breast: Secondary | ICD-10-CM

## 2022-01-01 DIAGNOSIS — R051 Acute cough: Secondary | ICD-10-CM

## 2022-01-01 DIAGNOSIS — B3731 Acute candidiasis of vulva and vagina: Secondary | ICD-10-CM

## 2022-01-01 DIAGNOSIS — R921 Mammographic calcification found on diagnostic imaging of breast: Secondary | ICD-10-CM

## 2022-01-01 MED ORDER — DM-GUAIFENESIN ER 60-1200 MG PO TB12: 1.0000 | ORAL_TABLET | Freq: Two times a day (BID) | ORAL | 0 refills | Status: DC

## 2022-01-01 MED ORDER — FLUCONAZOLE 150 MG PO TABS: 150.0000 mg | ORAL_TABLET | Freq: Once | ORAL | 0 refills | Status: AC

## 2022-01-01 MED ORDER — HYDROCOD POLI-CHLORPHE POLI ER 10-8 MG/5ML PO SUER: 5.0000 mL | Freq: Every evening | ORAL | 0 refills | Status: DC | PRN

## 2022-01-01 MED ORDER — AZITHROMYCIN 250 MG PO TABS: ORAL_TABLET | ORAL | 0 refills | Status: AC

## 2022-01-01 NOTE — Patient Instructions (Addendum)
These are over the counter medication options:  ?Mucinex dm green label for cough or robitussin DM  ?Multivitamin or below vitamins  ?Vitamin C 1000 mg daily.  ?Vitamin D3 4000 Iu (units) daily.  ?Zinc 100 mg daily.  ?Quercetin 250-500 mg 2 times per day   ?Elderberry  ?Oil of oregano  ?cepacol or chloroseptic spray ?Warm salt water gargles +hydrogen peroxide ?Sugar free cough drops  ?Warm tea with honey and lemon  ?Hydration  ?Try to eat though you dont feel like it   ?Tylenol or Advil  ?Nasal saline and Flonase 2 sprays nasal congestion  ?If sneezing/runny nose over the counter allergy pill claritin,allegra, zyrtec, xyzal ? ? ?Monitor pulse oximeter, buy from [facility] if oxygen is less than 90 please go to the hospital.  ?   ?   ?Are you feeling really sick? Shortness of breath, cough, chest pain?, dizziness? Confusion  ? If so let me know  ?If worsening, go to hospital or [facility] clinic Urgent care for further treatment.    ?

## 2022-01-01 NOTE — Telephone Encounter (Signed)
Spoke with pt and scheduled her for a virtual visit with Dr. Olivia Mackie this afternoon. Pt also stated that she called and got her additional imaging already scheduled.  ?

## 2022-01-01 NOTE — Progress Notes (Signed)
Telephone Note ? ?I connected with Nancy Villegas ? on 01/01/22 at  1:20 PM EDT by a telephone and verified that I am speaking with the correct person using two identifiers. ? Location patient: [place] ?Location provider:work or home office ?Persons participating in the virtual visit: patient, provider ? ?I discussed the limitations and requested verbal permission for telemedicine visit. The patient expressed understanding and agreed to proceed. ? ? ?HPI: ? ?Acute telemedicine visit for : ?Pt c/o cough onset Monday, pt has parkinson's and she read you can't take cough medicine with parkinson's. But checked with neurologist and stated ok to take. Phelgm gets caught in throat, denies any sore throat,fever,wheezing or sob.  ? ?Pt's husband has cough as well ongoing for 10 days. Denies any covid testing at home. But does have test at home. Taking prednisone taper 12 day rx has 3 days left for poison ivy in eyes.  ? ?Tried benadryl at cough, no PND, no sore throat  ? ?Today +nasal congestion  ? ?-Pertinent past medical history: see below ? ? ?-Pertinent medication allergies:No Known Allergies ?-COVID-19 vaccine status:  ?Immunization History  ?Administered Date(s) Administered  ? Influenza,inj,Quad PF,6+ Mos 05/30/2013, 07/08/2014, 09/09/2015, 08/23/2017, 09/11/2018, 06/20/2020  ? Moderna Sars-Covid-2 Vaccination 11/15/2019, 12/13/2019  ? Tdap 05/30/2013  ? ? ? ?ROS: See pertinent positives and negatives per HPI. ? ?Past Medical History:  ?Diagnosis Date  ? Heart murmur   ? History of chicken pox   ? Urine, incontinence, stress female   ? leaking with laughter or very full bladder  ? ? ?Past Surgical History:  ?Procedure Laterality Date  ? NO PAST SURGERIES    ? ? ? ?Current Outpatient Medications:  ?  azithromycin (ZITHROMAX) 250 MG tablet, Take 2 tablets on day 1, then 1 tablet daily on days 2 through 5 with food, Disp: 6 tablet, Rfl: 0 ?  carbidopa-levodopa (SINEMET IR) 25-100 MG tablet, Take by mouth., Disp: , Rfl:  ?   Dextromethorphan-Guaifenesin 60-1200 MG 12hr tablet, Take 1 tablet by mouth every 12 (twelve) hours., Disp: 60 tablet, Rfl: 0 ?  estradiol (ESTRACE) 1 MG tablet, TAKE 1 TABLET(1 MG) BY MOUTH DAILY, Disp: 90 tablet, Rfl: 0 ?  fluconazole (DIFLUCAN) 150 MG tablet, Take 1 tablet (150 mg total) by mouth once for 1 dose., Disp: 1 tablet, Rfl: 0 ?  progesterone (PROMETRIUM) 200 MG capsule, TAKE 1 CAPSULE BY MOUTH DAILY AT NIGHT FOR 12 DAYS PER MONTH, Disp: 36 capsule, Rfl: 3 ? ?EXAM: ? ?VITALS per patient if applicable: ? ?GENERAL: alert, oriented, appears well and in no acute distress ? ?HEENT: atraumatic, conjunttiva clear, no obvious abnormalities on inspection of external nose and ears ? ?NECK: normal movements of the head and neck ? ?LUNGS: on inspection no signs of respiratory distress, breathing rate appears normal, no obvious gross SOB, gasping or wheezing ? ?CV: no obvious cyanosis ? ?MS: moves all visible extremities without noticeable abnormality ? ?PSYCH/NEURO: pleasant and cooperative, no obvious depression or anxiety, speech and thought processing grossly intact ? ?ASSESSMENT AND PLAN: ? ?Discussed the following assessment and plan: ? ?Acute cough - Plan: Dextromethorphan-Guaifenesin 60-1200 MG 12hr tablet, azithromycin (ZITHROMAX) 250 MG tablet ?Prn tussionex  ?If continues will need cxr  ?No h/o allergies ?Pt will test for covid and my chart back  ? ?Yeast vaginitis - Plan: fluconazole (DIFLUCAN) 150 MG tablet ?These are over the counter medication options:  ?Mucinex dm green label for cough or robitussin DM  ?Multivitamin or below vitamins  ?Vitamin C  1000 mg daily.  ?Vitamin D3 4000 Iu (units) daily.  ?Zinc 100 mg daily.  ?Quercetin 250-500 mg 2 times per day   ?Elderberry  ?Oil of oregano  ?cepacol or chloroseptic spray ?Warm salt water gargles +hydrogen peroxide ?Sugar free cough drops  ?Warm tea with honey and lemon  ?Hydration  ?Try to eat though you dont feel like it   ?Tylenol or Advil  ?Nasal  saline and Flonase 2 sprays nasal congestion  ?If sneezing/runny nose over the counter allergy pill claritin,allegra, zyrtec, xyzal ? ? ? ?-we discussed possible serious and likely etiologies, options for evaluation and workup, limitations of telemedicine visit vs in person visit, treatment, treatment risks and precautions. Pt is agreeable to treatment via telemedicine at this moment.  ? ? ?I discussed the assessment and treatment plan with the patient. The patient was provided an opportunity to ask questions and all were answered. The patient agreed with the plan and demonstrated an understanding of the instructions. ?  ? ?Time spent 20 minutes ?Pasty Spillers McLean-Scocuzza, MD   ?

## 2022-01-07 DIAGNOSIS — L03032 Cellulitis of left toe: Secondary | ICD-10-CM | POA: Diagnosis not present

## 2022-01-07 DIAGNOSIS — L600 Ingrowing nail: Secondary | ICD-10-CM | POA: Diagnosis not present

## 2022-01-11 DIAGNOSIS — M2022 Hallux rigidus, left foot: Secondary | ICD-10-CM | POA: Diagnosis not present

## 2022-01-11 DIAGNOSIS — M79672 Pain in left foot: Secondary | ICD-10-CM | POA: Diagnosis not present

## 2022-01-13 ENCOUNTER — Telehealth: Payer: Self-pay

## 2022-01-13 ENCOUNTER — Ambulatory Visit
Admission: RE | Admit: 2022-01-13 | Discharge: 2022-01-13 | Disposition: A | Discharge status: Home / Self Care | Payer: BC Managed Care – PPO | Source: Ambulatory Visit | Attending: Internal Medicine | Admitting: Internal Medicine

## 2022-01-13 ENCOUNTER — Encounter: Payer: Self-pay | Admitting: Internal Medicine

## 2022-01-13 ENCOUNTER — Other Ambulatory Visit: Payer: Self-pay | Admitting: Internal Medicine

## 2022-01-13 DIAGNOSIS — R921 Mammographic calcification found on diagnostic imaging of breast: Secondary | ICD-10-CM

## 2022-01-13 DIAGNOSIS — R928 Other abnormal and inconclusive findings on diagnostic imaging of breast: Secondary | ICD-10-CM

## 2022-01-13 DIAGNOSIS — N6489 Other specified disorders of breast: Secondary | ICD-10-CM | POA: Diagnosis not present

## 2022-01-13 MED ORDER — FLUCONAZOLE 150 MG PO TABS: 150.0000 mg | ORAL_TABLET | Freq: Every day | ORAL | 0 refills | Status: DC

## 2022-01-13 NOTE — Telephone Encounter (Signed)
Sent to Dr. Tullo as a telephone encounter 

## 2022-01-13 NOTE — Addendum Note (Signed)
Addended by: Crecencio Mc on: 01/13/2022 01:06 PM ? ? Modules accepted: Orders ? ?

## 2022-01-13 NOTE — Telephone Encounter (Signed)
Nancy Villegas  P [facility] (supporting Sherlene Shams, MD) 6 hours ago (3:16 AM)  ? ?Good morning.  I took a z pack which the Dr told me might cause a yeast infection.  She called in one pill just incase.  I did get a yeast infection and I took the one pill however it did not cure the infection.   ?Please call in something else for my yeast infection.  I am leaving town tomorrow afternoon (May 11) for the weekend so I need to pick it up as soon as possible.  ( it is driving me crazy) ?Thank you so much! ?[name] ?

## 2022-01-17 ENCOUNTER — Encounter: Payer: Self-pay | Admitting: Internal Medicine

## 2022-02-21 ENCOUNTER — Other Ambulatory Visit: Payer: Self-pay | Admitting: Internal Medicine

## 2022-02-23 ENCOUNTER — Telehealth: Payer: Self-pay

## 2022-02-23 MED ORDER — FLUCONAZOLE 150 MG PO TABS: 150.0000 mg | ORAL_TABLET | Freq: Every day | ORAL | 0 refills | Status: DC

## 2022-02-23 NOTE — Telephone Encounter (Signed)
LMTCB

## 2022-02-23 NOTE — Telephone Encounter (Signed)
Patient states Dr. Duncan Dull prescribed fluconazole (DIFLUCAN) 150 MG tablet for her yeast infection.  Patient states the infection has not quite cleared up and would like to know if Dr. Darrick Huntsman would prescribe some more medicine for her.  *Patient states her preferred pharmacy is [facility] on [address] in [place].

## 2022-02-23 NOTE — Telephone Encounter (Signed)
Done.   Will need pelvic exam 2nd round doesn't clear it up

## 2022-02-25 NOTE — Telephone Encounter (Signed)
Spoke with pt and she stated that the medication did not help with symptoms. Pt was advised that she would need to schedule an appt for a pelvic exam. Pt stated that she would have to give the office a call back when she got home to schedule. Please schedule when pt returns call.

## 2022-03-16 DIAGNOSIS — Z1211 Encounter for screening for malignant neoplasm of colon: Secondary | ICD-10-CM | POA: Diagnosis not present

## 2022-06-08 DIAGNOSIS — M24572 Contracture, left ankle: Secondary | ICD-10-CM | POA: Diagnosis not present

## 2022-06-08 DIAGNOSIS — G5732 Lesion of lateral popliteal nerve, left lower limb: Secondary | ICD-10-CM | POA: Diagnosis not present

## 2022-06-08 DIAGNOSIS — M79672 Pain in left foot: Secondary | ICD-10-CM | POA: Diagnosis not present

## 2022-06-08 DIAGNOSIS — M2022 Hallux rigidus, left foot: Secondary | ICD-10-CM | POA: Diagnosis not present

## 2022-06-09 ENCOUNTER — Encounter: Payer: Self-pay | Admitting: Internal Medicine

## 2022-06-09 DIAGNOSIS — R5383 Other fatigue: Secondary | ICD-10-CM

## 2022-06-09 DIAGNOSIS — E785 Hyperlipidemia, unspecified: Secondary | ICD-10-CM

## 2022-06-09 DIAGNOSIS — R7301 Impaired fasting glucose: Secondary | ICD-10-CM

## 2022-06-30 ENCOUNTER — Telehealth: Payer: Self-pay | Admitting: Internal Medicine

## 2022-06-30 NOTE — Telephone Encounter (Signed)
Pt came by the office to drop off a screening form for the provider to complete. Placed in provider folder.

## 2022-06-30 NOTE — Telephone Encounter (Signed)
Spoke with pt and advised her that she will need to drop off a copy of the form for both her and her husband so we can fill them out. Pt was also advised that we will call for pick up when completed. Pt gave a verbal understanding.

## 2022-07-01 NOTE — Telephone Encounter (Signed)
Received

## 2022-07-01 NOTE — Telephone Encounter (Signed)
Labs have been ordered for lab appt.  

## 2022-07-06 ENCOUNTER — Other Ambulatory Visit (INDEPENDENT_AMBULATORY_CARE_PROVIDER_SITE_OTHER): Payer: BC Managed Care – PPO

## 2022-07-06 DIAGNOSIS — R5383 Other fatigue: Secondary | ICD-10-CM

## 2022-07-06 DIAGNOSIS — R7301 Impaired fasting glucose: Secondary | ICD-10-CM | POA: Diagnosis not present

## 2022-07-06 DIAGNOSIS — E785 Hyperlipidemia, unspecified: Secondary | ICD-10-CM | POA: Diagnosis not present

## 2022-07-06 LAB — HEMOGLOBIN A1C: Hgb A1c MFr Bld: 5.8 % (ref 4.6–6.5)

## 2022-07-06 LAB — CBC WITH DIFFERENTIAL/PLATELET
Basophils Absolute: 0 10*3/uL (ref 0.0–0.1)
Basophils Relative: 0.6 % (ref 0.0–3.0)
Eosinophils Absolute: 0.1 10*3/uL (ref 0.0–0.7)
Eosinophils Relative: 1.6 % (ref 0.0–5.0)
HCT: 35.4 % — ABNORMAL LOW (ref 36.0–46.0)
Hemoglobin: 12 g/dL (ref 12.0–15.0)
Lymphocytes Relative: 31.1 % (ref 12.0–46.0)
Lymphs Abs: 1.6 10*3/uL (ref 0.7–4.0)
MCHC: 33.9 g/dL (ref 30.0–36.0)
MCV: 97.3 fl (ref 78.0–100.0)
Monocytes Absolute: 0.4 10*3/uL (ref 0.1–1.0)
Monocytes Relative: 7.7 % (ref 3.0–12.0)
Neutro Abs: 3 10*3/uL (ref 1.4–7.7)
Neutrophils Relative %: 59 % (ref 43.0–77.0)
Platelets: 239 10*3/uL (ref 150.0–400.0)
RBC: 3.64 Mil/uL — ABNORMAL LOW (ref 3.87–5.11)
RDW: 13 % (ref 11.5–15.5)
WBC: 5 10*3/uL (ref 4.0–10.5)

## 2022-07-06 LAB — LIPID PANEL
Cholesterol: 200 mg/dL (ref 0–200)
HDL: 104.5 mg/dL (ref >39.00)
LDL Cholesterol: 82 mg/dL (ref 0–99)
NonHDL: 95.11
Total CHOL/HDL Ratio: 2
Triglycerides: 65 mg/dL (ref 0.0–149.0)
VLDL: 13 mg/dL (ref 0.0–40.0)

## 2022-07-06 LAB — LDL CHOLESTEROL, DIRECT: Direct LDL: 84 mg/dL

## 2022-07-06 LAB — COMPREHENSIVE METABOLIC PANEL
ALT: 4 U/L (ref 0–35)
AST: 16 U/L (ref 0–37)
Albumin: 4.2 g/dL (ref 3.5–5.2)
Alkaline Phosphatase: 52 U/L (ref 39–117)
BUN: 24 mg/dL — ABNORMAL HIGH (ref 6–23)
CO2: 28 mEq/L (ref 19–32)
Calcium: 9.4 mg/dL (ref 8.4–10.5)
Chloride: 103 mEq/L (ref 96–112)
Creatinine, Ser: 0.54 mg/dL (ref 0.40–1.20)
GFR: 100.02 mL/min (ref >60.00)
Glucose, Bld: 96 mg/dL (ref 70–99)
Potassium: 4.1 mEq/L (ref 3.5–5.1)
Sodium: 140 mEq/L (ref 135–145)
Total Bilirubin: 0.6 mg/dL (ref 0.2–1.2)
Total Protein: 6.6 g/dL (ref 6.0–8.3)

## 2022-07-06 LAB — TSH: TSH: 0.79 u[IU]/mL (ref 0.35–5.50)

## 2022-07-20 ENCOUNTER — Other Ambulatory Visit: Payer: Self-pay | Admitting: Internal Medicine

## 2022-07-20 ENCOUNTER — Ambulatory Visit
Admission: RE | Admit: 2022-07-20 | Discharge: 2022-07-20 | Disposition: A | Discharge status: Home / Self Care | Payer: BC Managed Care – PPO | Source: Ambulatory Visit | Attending: Internal Medicine | Admitting: Internal Medicine

## 2022-07-20 DIAGNOSIS — R921 Mammographic calcification found on diagnostic imaging of breast: Secondary | ICD-10-CM | POA: Diagnosis not present

## 2022-07-20 DIAGNOSIS — N6489 Other specified disorders of breast: Secondary | ICD-10-CM

## 2022-08-09 DIAGNOSIS — Z23 Encounter for immunization: Secondary | ICD-10-CM | POA: Diagnosis not present

## 2022-08-09 DIAGNOSIS — Z79899 Other long term (current) drug therapy: Secondary | ICD-10-CM | POA: Diagnosis not present

## 2022-08-09 DIAGNOSIS — G20A1 Parkinson's disease without dyskinesia, without mention of fluctuations: Secondary | ICD-10-CM | POA: Diagnosis not present

## 2022-08-16 ENCOUNTER — Encounter: Payer: BC Managed Care – PPO | Admitting: Internal Medicine

## 2022-08-19 ENCOUNTER — Other Ambulatory Visit: Payer: Self-pay | Admitting: Internal Medicine

## 2022-09-13 DIAGNOSIS — S86312A Strain of muscle(s) and tendon(s) of peroneal muscle group at lower leg level, left leg, initial encounter: Secondary | ICD-10-CM | POA: Diagnosis not present

## 2022-09-13 DIAGNOSIS — M24572 Contracture, left ankle: Secondary | ICD-10-CM | POA: Diagnosis not present

## 2022-09-27 ENCOUNTER — Encounter: Payer: Self-pay | Admitting: Internal Medicine

## 2022-09-27 NOTE — Telephone Encounter (Signed)
Ok to send referral to [facility] ?

## 2022-09-28 DIAGNOSIS — M25532 Pain in left wrist: Secondary | ICD-10-CM | POA: Diagnosis not present

## 2022-09-29 ENCOUNTER — Other Ambulatory Visit: Payer: Self-pay

## 2022-09-29 DIAGNOSIS — Z1211 Encounter for screening for malignant neoplasm of colon: Secondary | ICD-10-CM

## 2022-09-29 NOTE — Telephone Encounter (Signed)
Referral placed

## 2022-10-05 DIAGNOSIS — M25532 Pain in left wrist: Secondary | ICD-10-CM | POA: Diagnosis not present

## 2022-10-15 ENCOUNTER — Other Ambulatory Visit: Payer: Self-pay | Admitting: Internal Medicine

## 2022-10-18 NOTE — Telephone Encounter (Signed)
Referral was placed on 09/29/2022. Pt stated they still have not received.

## 2022-10-20 DIAGNOSIS — S52502D Unspecified fracture of the lower end of left radius, subsequent encounter for closed fracture with routine healing: Secondary | ICD-10-CM | POA: Diagnosis not present

## 2022-11-12 DIAGNOSIS — S6292XD Unspecified fracture of left wrist and hand, subsequent encounter for fracture with routine healing: Secondary | ICD-10-CM | POA: Diagnosis not present

## 2022-11-15 DIAGNOSIS — S52502D Unspecified fracture of the lower end of left radius, subsequent encounter for closed fracture with routine healing: Secondary | ICD-10-CM | POA: Diagnosis not present

## 2022-11-28 ENCOUNTER — Encounter: Payer: Self-pay | Admitting: Internal Medicine

## 2022-11-29 ENCOUNTER — Encounter: Payer: Self-pay | Admitting: Internal Medicine

## 2022-11-29 MED ORDER — PROGESTERONE 200 MG PO CAPS: ORAL_CAPSULE | ORAL | 0 refills | Status: DC

## 2022-11-29 MED ORDER — ESTRADIOL 1 MG PO TABS: 1.0000 mg | ORAL_TABLET | Freq: Every day | ORAL | 0 refills | Status: DC

## 2022-11-29 NOTE — Addendum Note (Signed)
Addended by: Adair Laundry on: 11/29/2022 04:59 PM   Modules accepted: Orders

## 2022-11-29 NOTE — Telephone Encounter (Signed)
Both estradiol and progesterone have been sent in to pharmacy.

## 2022-12-31 ENCOUNTER — Other Ambulatory Visit: Payer: Self-pay | Admitting: Internal Medicine

## 2022-12-31 ENCOUNTER — Encounter: Payer: Self-pay | Admitting: Internal Medicine

## 2022-12-31 DIAGNOSIS — L247 Irritant contact dermatitis due to plants, except food: Secondary | ICD-10-CM | POA: Insufficient documentation

## 2022-12-31 MED ORDER — PREDNISONE 10 MG PO TABS: ORAL_TABLET | ORAL | 0 refills | Status: DC

## 2022-12-31 NOTE — Assessment & Plan Note (Signed)
Left eyelid

## 2023-01-11 ENCOUNTER — Other Ambulatory Visit (HOSPITAL_COMMUNITY)
Admission: RE | Admit: 2023-01-11 | Discharge: 2023-01-11 | Disposition: A | Discharge status: Home / Self Care | Payer: BC Managed Care – PPO | Source: Ambulatory Visit | Attending: Internal Medicine | Admitting: Internal Medicine

## 2023-01-11 ENCOUNTER — Ambulatory Visit (INDEPENDENT_AMBULATORY_CARE_PROVIDER_SITE_OTHER): Payer: BC Managed Care – PPO | Admitting: Internal Medicine

## 2023-01-11 ENCOUNTER — Encounter: Payer: Self-pay | Admitting: Internal Medicine

## 2023-01-11 VITALS — BP 122/78 | HR 78 | Temp 98.0°F | Ht 66.0 in | Wt 139.4 lb

## 2023-01-11 DIAGNOSIS — Z124 Encounter for screening for malignant neoplasm of cervix: Secondary | ICD-10-CM

## 2023-01-11 DIAGNOSIS — E785 Hyperlipidemia, unspecified: Secondary | ICD-10-CM

## 2023-01-11 DIAGNOSIS — R7301 Impaired fasting glucose: Secondary | ICD-10-CM | POA: Diagnosis not present

## 2023-01-11 DIAGNOSIS — Z0000 Encounter for general adult medical examination without abnormal findings: Secondary | ICD-10-CM | POA: Diagnosis not present

## 2023-01-11 DIAGNOSIS — G20B2 Parkinson's disease with dyskinesia, with fluctuations: Secondary | ICD-10-CM

## 2023-01-11 DIAGNOSIS — R5383 Other fatigue: Secondary | ICD-10-CM

## 2023-01-11 MED ORDER — ESTRADIOL 1 MG PO TABS: 1.0000 mg | ORAL_TABLET | Freq: Every day | ORAL | 0 refills | Status: DC

## 2023-01-11 MED ORDER — FLUCONAZOLE 150 MG PO TABS: 150.0000 mg | ORAL_TABLET | Freq: Every day | ORAL | 0 refills | Status: DC

## 2023-01-11 MED ORDER — PROGESTERONE 200 MG PO CAPS: ORAL_CAPSULE | ORAL | 0 refills | Status: DC

## 2023-01-11 MED ORDER — TRIAMCINOLONE ACETONIDE 0.1 % EX CREA: 1.0000 | TOPICAL_CREAM | Freq: Two times a day (BID) | CUTANEOUS | 0 refills | Status: DC

## 2023-01-11 NOTE — Assessment & Plan Note (Signed)
Stiffness is improving. She is playing pickle ball and working out at [employer]. Prefers for family (daughters) to remain unaware of her disease thus far so she is avoiding "rock steady' proram.    Has developed a slight tremor of left hand and left ankle  Reviewed use of sinemet, use is limited by sedating effects.

## 2023-01-11 NOTE — Progress Notes (Signed)
Patient ID: Nancy Villegas, female    DOB: [birth year]  Age: 62 y.o. MRN: [id number]  The patient is here for annual preventive examination and management of other chronic and acute problems.   The risk factors are reflected in the social history.   The roster of all physicians providing medical care to patient - is listed in the Snapshot section of the chart.   Activities of daily living:  The patient is 100% independent in all ADLs: dressing, toileting, feeding as well as independent mobility   Home safety : The patient has smoke detectors in the home. They wear seatbelts.  There are no unsecured firearms at home. There is no violence in the home.    There is no risks for hepatitis, STDs or HIV. There is no   history of blood transfusion. They have no travel history to infectious disease endemic areas of the world.   The patient has seen their dentist in the last six month. They have seen their eye doctor in the last year. The patinet  denies slight hearing difficulty with regard to whispered voices and some television programs.  They have deferred audiologic testing in the last year.  They do not  have excessive sun exposure. Discussed the need for sun protection: hats, long sleeves and use of sunscreen if there is significant sun exposure.    Diet: the importance of a healthy diet is discussed. They do have a healthy diet.   The benefits of regular aerobic exercise were discussed. The patient  exercises  3 to 5 days per week  for  60 minutes.    Depression screen: there are no signs or vegative symptoms of depression- irritability, change in appetite, anhedonia, sadness/tearfullness.   The following portions of the patient's history were reviewed and updated as appropriate: allergies, current medications, past family history, past medical history,  past surgical history, past social history  and problem list.   Visual acuity was not assessed per patient preference since the patient has  regular follow up with an  ophthalmologist. Hearing and body mass index were assessed and reviewed.    During the course of the visit the patient was educated and counseled about appropriate screening and preventive services including : fall prevention , diabetes screening, nutrition counseling, colorectal cancer screening, and recommended immunizations.    Chief Complaint:  HPI     Annual Exam    Additional comments: Physical        Last edited by Sandy Salaam, CMA on 01/11/2023  3:30 PM.        Review of Symptoms  Patient denies headache, fevers, malaise, unintentional weight loss, skin rash, eye pain, sinus congestion and sinus pain, sore throat, dysphagia,  hemoptysis , cough, dyspnea, wheezing, chest pain, palpitations, orthopnea, edema, abdominal pain, nausea, melena, diarrhea, constipation, flank pain, dysuria, hematuria, urinary  Frequency, nocturia, numbness, tingling, seizures,  Focal weakness, Loss of consciousness,  Tremor, insomnia, depression, anxiety, and suicidal ideation.    Physical Exam:  BP 122/78   Pulse 78   Temp 98 F (36.7 C) (Oral)   Ht 5\' 6"  (1.676 m)   Wt 139 lb 6.4 oz (63.2 kg)   SpO2 97%   BMI 22.50 kg/m    Physical Exam Vitals reviewed.  Constitutional:      General: She is not in acute distress.    Appearance: Normal appearance. She is well-developed and normal weight. She is not ill-appearing, toxic-appearing or diaphoretic.  HENT:     Head:  Normocephalic.     Right Ear: Tympanic membrane, ear canal and external ear normal. There is no impacted cerumen.     Left Ear: Tympanic membrane, ear canal and external ear normal. There is no impacted cerumen.     Nose: Nose normal.     Mouth/Throat:     Mouth: Mucous membranes are moist.     Pharynx: Oropharynx is clear.  Eyes:     General: No scleral icterus.       Right eye: No discharge.        Left eye: No discharge.     Conjunctiva/sclera: Conjunctivae normal.     Pupils: Pupils are  equal, round, and reactive to light.  Neck:     Thyroid: No thyromegaly.     Vascular: No carotid bruit or JVD.  Cardiovascular:     Rate and Rhythm: Normal rate and regular rhythm.     Heart sounds: Normal heart sounds.  Pulmonary:     Effort: Pulmonary effort is normal. No respiratory distress.     Breath sounds: Normal breath sounds.  Chest:  Breasts:    Breasts are symmetrical.     Right: Normal. No swelling, inverted nipple, mass, nipple discharge, skin change or tenderness.     Left: Normal. No swelling, inverted nipple, mass, nipple discharge, skin change or tenderness.  Abdominal:     General: Bowel sounds are normal.     Palpations: Abdomen is soft. There is no mass.     Tenderness: There is no abdominal tenderness. There is no guarding or rebound.     Hernia: There is no hernia in the left inguinal area or right inguinal area.  Genitourinary:    Exam position: Lithotomy position.     Pubic Area: No rash or pubic lice.      Labia:        Right: No rash, tenderness, lesion or injury.        Left: No rash, tenderness, lesion or injury.      Vagina: Normal.     Cervix: Normal.     Uterus: Normal.      Adnexa: Right adnexa normal and left adnexa normal.  Musculoskeletal:        General: Normal range of motion.     Cervical back: Normal range of motion and neck supple.  Lymphadenopathy:     Cervical: No cervical adenopathy.     Upper Body:     Right upper body: No supraclavicular, axillary or pectoral adenopathy.     Left upper body: No supraclavicular, axillary or pectoral adenopathy.     Lower Body: No right inguinal adenopathy. No left inguinal adenopathy.  Skin:    General: Skin is warm and dry.  Neurological:     General: No focal deficit present.     Mental Status: She is alert and oriented to person, place, and time. Mental status is at baseline.  Psychiatric:        Mood and Affect: Mood normal.        Behavior: Behavior normal.        Thought Content:  Thought content normal.        Judgment: Judgment normal.    Assessment and Plan: Cervical cancer screening -     Cytology - PAP  Encounter for preventive health examination Assessment & Plan: age appropriate education and counseling updated, referrals for preventative services and immunizations addressed, dietary and smoking counseling addressed, most recent labs reviewed.  I have personally reviewed and have  noted:   1) the patient's medical and social history 2) The pt's use of alcohol, tobacco, and illicit drugs 3) The patient's current medications and supplements 4) Functional ability including ADL's, fall risk, home safety risk, hearing and visual impairment 5) Diet and physical activities 6) Evidence for depression or mood disorder 7) The patient's height, weight, and BMI have been recorded in the chart   I have made referrals, and provided counseling and education based on review of the above    Hyperlipidemia LDL goal <160 -     Comprehensive metabolic panel -     Lipid panel -     LDL cholesterol, direct  Impaired fasting glucose -     Comprehensive metabolic panel  Other fatigue -     CBC with Differential/Platelet  Parkinson's disease with dyskinesia and fluctuating manifestations Assessment & Plan: Stiffness is improving. She is playing pickle ball and working out at [employer] Prefers for family (daughters) to remain unaware of her disease thus far so she is avoiding "rock steady' proram.    Has developed a slight tremor of left hand and left ankle  Reviewed use of sinemet, use is limited by sedating effects.     Other orders -     Estradiol; Take 1 tablet (1 mg total) by mouth daily.  Dispense: 30 tablet; Refill: 0 -     Progesterone; TAKE 1 CAPSULE BY MOUTH DAILY AT NIGHT FOR 12 DAYS PER MONTH  Dispense: 36 capsule; Refill: 0 -     Triamcinolone Acetonide; Apply 1 Application topically 2 (two) times daily.  Dispense: 80 g; Refill: 0 -     Fluconazole; Take 1  tablet (150 mg total) by mouth daily.  Dispense: 2 tablet; Refill: 0    No follow-ups on file.  Sherlene Shams, MD

## 2023-01-11 NOTE — Patient Instructions (Addendum)
You can  schedule  visit for your shingles vaccines  (2 doses,  2 to 6 months apart)  TDap vaccine this September .  Can be done here or at your pharmacy  Fluconazole 150 mg daily x 2 doses for yeast infection.

## 2023-01-11 NOTE — Assessment & Plan Note (Signed)

## 2023-01-12 LAB — CBC WITH DIFFERENTIAL/PLATELET
Basophils Absolute: 0.1 10*3/uL (ref 0.0–0.1)
Basophils Relative: 1.3 % (ref 0.0–3.0)
Eosinophils Absolute: 0.1 10*3/uL (ref 0.0–0.7)
Eosinophils Relative: 1.5 % (ref 0.0–5.0)
HCT: 38.2 % (ref 36.0–46.0)
Hemoglobin: 13.1 g/dL (ref 12.0–15.0)
Lymphocytes Relative: 29.5 % (ref 12.0–46.0)
Lymphs Abs: 2 10*3/uL (ref 0.7–4.0)
MCHC: 34.2 g/dL (ref 30.0–36.0)
MCV: 97.5 fl (ref 78.0–100.0)
Monocytes Absolute: 0.6 10*3/uL (ref 0.1–1.0)
Monocytes Relative: 8.6 % (ref 3.0–12.0)
Neutro Abs: 4.1 10*3/uL (ref 1.4–7.7)
Neutrophils Relative %: 59.1 % (ref 43.0–77.0)
Platelets: 275 10*3/uL (ref 150.0–400.0)
RBC: 3.91 Mil/uL (ref 3.87–5.11)
RDW: 13.5 % (ref 11.5–15.5)
WBC: 6.9 10*3/uL (ref 4.0–10.5)

## 2023-01-12 LAB — COMPREHENSIVE METABOLIC PANEL
ALT: 7 U/L (ref 0–35)
AST: 16 U/L (ref 0–37)
Albumin: 4.3 g/dL (ref 3.5–5.2)
Alkaline Phosphatase: 54 U/L (ref 39–117)
BUN: 20 mg/dL (ref 6–23)
CO2: 29 mEq/L (ref 19–32)
Calcium: 9.4 mg/dL (ref 8.4–10.5)
Chloride: 101 mEq/L (ref 96–112)
Creatinine, Ser: 0.59 mg/dL (ref 0.40–1.20)
GFR: 97.56 mL/min (ref >60.00)
Glucose, Bld: 102 mg/dL — ABNORMAL HIGH (ref 70–99)
Potassium: 3.8 mEq/L (ref 3.5–5.1)
Sodium: 139 mEq/L (ref 135–145)
Total Bilirubin: 0.3 mg/dL (ref 0.2–1.2)
Total Protein: 7 g/dL (ref 6.0–8.3)

## 2023-01-12 LAB — LDL CHOLESTEROL, DIRECT: Direct LDL: 94 mg/dL

## 2023-01-12 LAB — LIPID PANEL
Cholesterol: 223 mg/dL — ABNORMAL HIGH (ref 0–200)
HDL: 108.3 mg/dL (ref >39.00)
LDL Cholesterol: 94 mg/dL (ref 0–99)
NonHDL: 115.05
Total CHOL/HDL Ratio: 2
Triglycerides: 105 mg/dL (ref 0.0–149.0)
VLDL: 21 mg/dL (ref 0.0–40.0)

## 2023-01-13 LAB — CYTOLOGY - PAP
Adequacy: ABSENT
Comment: NEGATIVE
Diagnosis: NEGATIVE
High risk HPV: NEGATIVE

## 2023-01-17 ENCOUNTER — Encounter: Payer: Self-pay | Admitting: Internal Medicine

## 2023-01-19 ENCOUNTER — Ambulatory Visit
Admission: RE | Admit: 2023-01-19 | Discharge: 2023-01-19 | Disposition: A | Discharge status: Home / Self Care | Payer: BC Managed Care – PPO | Source: Ambulatory Visit | Attending: Internal Medicine | Admitting: Internal Medicine

## 2023-01-19 DIAGNOSIS — R921 Mammographic calcification found on diagnostic imaging of breast: Secondary | ICD-10-CM | POA: Diagnosis not present

## 2023-01-19 DIAGNOSIS — N6489 Other specified disorders of breast: Secondary | ICD-10-CM

## 2023-01-24 DIAGNOSIS — M25532 Pain in left wrist: Secondary | ICD-10-CM | POA: Diagnosis not present

## 2023-01-27 DIAGNOSIS — M2042 Other hammer toe(s) (acquired), left foot: Secondary | ICD-10-CM | POA: Diagnosis not present

## 2023-01-27 DIAGNOSIS — L03032 Cellulitis of left toe: Secondary | ICD-10-CM | POA: Diagnosis not present

## 2023-01-27 DIAGNOSIS — M79672 Pain in left foot: Secondary | ICD-10-CM | POA: Diagnosis not present

## 2023-02-05 ENCOUNTER — Other Ambulatory Visit: Payer: Self-pay | Admitting: Internal Medicine

## 2023-02-10 DIAGNOSIS — R278 Other lack of coordination: Secondary | ICD-10-CM | POA: Diagnosis not present

## 2023-02-10 DIAGNOSIS — R471 Dysarthria and anarthria: Secondary | ICD-10-CM | POA: Diagnosis not present

## 2023-02-10 DIAGNOSIS — G20A1 Parkinson's disease without dyskinesia, without mention of fluctuations: Secondary | ICD-10-CM | POA: Diagnosis not present

## 2023-02-10 DIAGNOSIS — R1312 Dysphagia, oropharyngeal phase: Secondary | ICD-10-CM | POA: Diagnosis not present

## 2023-02-10 DIAGNOSIS — R2689 Other abnormalities of gait and mobility: Secondary | ICD-10-CM | POA: Diagnosis not present

## 2023-02-21 ENCOUNTER — Encounter: Payer: Self-pay | Admitting: Internal Medicine

## 2023-03-25 ENCOUNTER — Encounter: Payer: Self-pay | Admitting: Internal Medicine

## 2023-03-25 MED ORDER — PROGESTERONE 200 MG PO CAPS: ORAL_CAPSULE | ORAL | 0 refills | Status: DC

## 2023-04-29 DIAGNOSIS — Z01818 Encounter for other preprocedural examination: Secondary | ICD-10-CM | POA: Diagnosis not present

## 2023-04-29 DIAGNOSIS — Z1211 Encounter for screening for malignant neoplasm of colon: Secondary | ICD-10-CM | POA: Diagnosis not present

## 2023-05-10 ENCOUNTER — Encounter: Payer: Self-pay | Admitting: Internal Medicine

## 2023-06-08 ENCOUNTER — Other Ambulatory Visit: Payer: Self-pay | Admitting: Internal Medicine

## 2023-06-30 ENCOUNTER — Ambulatory Visit: Payer: BC Managed Care – PPO

## 2023-06-30 DIAGNOSIS — Z1211 Encounter for screening for malignant neoplasm of colon: Secondary | ICD-10-CM | POA: Diagnosis not present

## 2023-06-30 DIAGNOSIS — K640 First degree hemorrhoids: Secondary | ICD-10-CM | POA: Diagnosis not present

## 2023-07-06 ENCOUNTER — Other Ambulatory Visit: Payer: Self-pay | Admitting: Internal Medicine

## 2023-07-15 ENCOUNTER — Encounter: Payer: Self-pay | Admitting: Internal Medicine

## 2023-07-18 DIAGNOSIS — M79602 Pain in left arm: Secondary | ICD-10-CM | POA: Diagnosis not present

## 2023-09-01 ENCOUNTER — Encounter: Payer: Self-pay | Admitting: Internal Medicine

## 2023-09-01 ENCOUNTER — Ambulatory Visit: Payer: Self-pay | Admitting: Internal Medicine

## 2023-09-01 NOTE — Telephone Encounter (Signed)
Copied from CRM [id number]. Topic: Clinical - Red Word Triage >> Sep 01, 2023 11:12 AM Steele Sizer wrote: Red Word that prompted transfer to Nurse Triage: Pt stated that she has been sick for about a week now having a cold, cough, body aches, fever, and chills. Pt stated that she took a Covid test and it came out negative.   Chief Complaint: Body aches, "feels like flu" Symptoms: Body aches with leg cramping, cough, headache, loss of appetite, fatigue, some congestion and runny nose Frequency: Continual, worsening after improving Pertinent Negatives: Patient denies SOB, chest pain, redness/swelling/warmth to BLE Disposition: [] ED /[] Urgent Care (no appt availability in office) / [x] Appointment(In office/virtual)/ []  [facility]/ [] Home Care/ [] Refused Recommended Disposition /[] [facility]/ []  Follow-up with PCP Additional Notes: Pt reporting symptoms of body aches with leg cramping, cough, headache, loss of appetite, fatigue, some congestion and runny nose. Pt reporting her worst symptom right now is body aches, mostly aching in legs/feet and back, some headache. Pt reporting that her symptoms began on last Wednesday, "started getting normal cold, then felt like getting better, but "got worse" on 12/24. Pt reporting no SOB, but "cough so much I feel hollow inside," pt reporting "haven't coughed anything up." Pt is coughing in somewhat fits on phone call, sounds dry to nurse, not wet or hacking. Pt denies SOB at this time. Pt reporting she thinks she has had a fever, skin feels "hot, sweaty, and have chills, more chills than fever," but pt reporting she has not taken her temperature. Pt denies chronic medical problems with heart and lung, reporting though that she has Parkinsons disease. Pt reporting it "feels like it's like the flu" reporting lack of appetite, "hardly eaten anything in 5 days, making myself eat." Pt reporting that she's trying to drink more water, reporting that "legs  were cramping," pt confirms cramping "mostly in lower legs, feet," but confirms no redness, swelling, or warmth to lower legs at this time. Pt denies trouble breathing at this time. Pt reporting that cough is not productive at this time but can "feel some stuff" in there. Pt reporting "little bit" of runny nose and congestion, reporting "ears and throat have not hurt at all," her "head hurting little bit on and off," lower back hurting quite a bit, been "sitting down a lot," reporting "not much energy." Pt denies chest pain at this time. Pt denies chronic medical problems, but confirms she does have Parkinsons disease. Pt reporting that she "need to figure out what meds to take today that work with" her Parkinsons meds. Advised pt be examined today in office, scheduled for today with PCP office. Advised pt call back for worsening or newly emerging symptoms, go to ED if SOB, severe pain, pr redness/warmth/swelling to lower legs. Pt verbalized understanding.  Reason for Disposition  [1] Fever returns after gone for over 24 hours AND [2] symptoms worse or not improved  Answer Assessment - Initial Assessment Questions 1. WORST SYMPTOM: "What is your worst symptom?" (e.g., cough, runny nose, muscle aches, headache, sore throat, fever)      Pt reporting her worst symptom right now is body aches, mostly aching in legs/feet and back, some headache. 2. ONSET: "When did your flu symptoms start?"      Pt reporting that her symptoms began on last Wednesday, "started getting normal cold, then felt like getting better, but "got worse" on 12/24. 3. COUGH: "How bad is the cough?"       Pt reporting  no SOB, but "cough so much I feel hollow inside," pt reporting "haven't coughed anything up." Pt is coughing in somewhat fits on phone call, sounds dry to nurse, not wet or hacking. 4. RESPIRATORY DISTRESS: "Describe your breathing."      Pt denies SOB at this time. 5. FEVER: "Do you have a fever?" If Yes, ask: "What is  your temperature, how was it measured, and when did it start?"     Pt reporting she thinks she has had a fever, skin feels "hot, sweaty, and have chills, more chills than fever," but pt reporting she has not taken her temperature.  8. HIGH RISK DISEASE: "Do you have any chronic medical problems?" (e.g., heart or lung disease, asthma, weak immune system, or other HIGH RISK conditions)     Pt denies chronic medical problems with heart and lung, reporting though that she has Parkinsons disease. 10. OTHER SYMPTOMS: "Do you have any other symptoms?"  (e.g., runny nose, muscle aches, headache, sore throat)       Pt reporting it "feels like it's like the flu" reporting lack of appetite, "hardly eaten anything in 5 days, making myself eat." Pt reporting that she's trying to drink more water, reporting that "legs were cramping," pt confirms cramping "mostly in lower legs, feet," but confirms no redness, swelling, or warmth to lower legs at this time. Pt denies trouble breathing at this time. Pt reporting that cough is not productive at this time but can "feel some stuff" in there. Pt reporting "little bit" of runny nose and congestion, reporting "ears and throat have not hurt at all," her "head hurting little bit on and off," lower back hurting quite a bit, been "sitting down a lot," reporting "not much energy." Pt denies chest pain at this time.  Protocols used: Influenza (Flu) - [facility]

## 2023-09-01 NOTE — Telephone Encounter (Addendum)
Called pt to inform her of nurse mistake in scheduling, informed pt that appt is actually for tomorrow 12/27 with Evelene Croon NP. Apologized for the miscommunication in telling pt the appt was today. Offered to look for earlier appt with other office since no availability today with PCP. Pt preferring to wait to meet with PCP office tomorrow. Pt verbalized understanding to call or seek more immediate care if worsening symptoms. Placed pt appt on waitlist for earlier appt. Pt needs to know what OTC meds are okay to take per her provider with her Parkinsons meds - please advise.

## 2023-09-01 NOTE — Telephone Encounter (Signed)
Noted  

## 2023-09-02 ENCOUNTER — Encounter: Payer: Self-pay | Admitting: Nurse Practitioner

## 2023-09-02 ENCOUNTER — Ambulatory Visit (INDEPENDENT_AMBULATORY_CARE_PROVIDER_SITE_OTHER): Payer: BC Managed Care – PPO | Admitting: Nurse Practitioner

## 2023-09-02 VITALS — BP 110/70 | HR 79 | Temp 97.6°F | Ht 66.0 in | Wt 137.4 lb

## 2023-09-02 DIAGNOSIS — R52 Pain, unspecified: Secondary | ICD-10-CM

## 2023-09-02 DIAGNOSIS — J069 Acute upper respiratory infection, unspecified: Secondary | ICD-10-CM | POA: Insufficient documentation

## 2023-09-02 DIAGNOSIS — R252 Cramp and spasm: Secondary | ICD-10-CM

## 2023-09-02 LAB — POC INFLUENZA A&B (BINAX/QUICKVUE)
Influenza A, POC: NEGATIVE
Influenza B, POC: NEGATIVE

## 2023-09-02 MED ORDER — AMOXICILLIN-POT CLAVULANATE 875-125 MG PO TABS: 1.0000 | ORAL_TABLET | Freq: Two times a day (BID) | ORAL | 0 refills | Status: DC

## 2023-09-02 MED ORDER — BENZONATATE 100 MG PO CAPS: 100.0000 mg | ORAL_CAPSULE | Freq: Three times a day (TID) | ORAL | 0 refills | Status: DC | PRN

## 2023-09-02 MED ORDER — PREDNISONE 20 MG PO TABS: 40.0000 mg | ORAL_TABLET | Freq: Every day | ORAL | 0 refills | Status: DC

## 2023-09-02 NOTE — Progress Notes (Unsigned)
Established Patient Office Visit  Subjective:  Patient ID: Nancy Villegas, female    DOB: 02-22-1961  Age: 62 y.o. MRN: [id number]  CC:  Chief Complaint  Patient presents with   Cough    X 10 days Covid neg on 08/31/2023   Fever   Chills    HPI  JAHIA DILUZIO presents for URI symptoms. Home test negative for COVID on 12/25/.   URI  This is a new problem. The current episode started 1 to 4 weeks ago. The problem has been waxing and waning. Maximum temperature: subjective fever. Associated symptoms include congestion, coughing, headaches and wheezing. Pertinent negatives include no ear pain, sinus pain, sneezing or sore throat. Associated symptoms comments: Leg cramps. She has tried decongestant and NSAIDs (alka seltzer) for the symptoms. The treatment provided mild relief.     Past Medical History:  Diagnosis Date   Heart murmur    History of chicken pox    Urine, incontinence, stress female    leaking with laughter or very full bladder    Past Surgical History:  Procedure Laterality Date   NO PAST SURGERIES      Family History  Problem Relation Age of Onset   Ulcerative colitis Mother    Leukemia Mother    Parkinsonism Mother 36       drug induced AML   COPD Father    Diabetes Father    Cancer Father        Renal Cell CA, Lung CA   Healthy Sister    Healthy Brother    Healthy Child    Breast cancer Neg Hx     Social History   Socioeconomic History   Marital status: Married    Spouse name: Not on file   Number of children: Not on file   Years of education: Not on file   Highest education level: Not on file  Occupational History   Occupation: unemployed    Comment: used to work at [employer] x 15 years  Tobacco Use   Smoking status: Never   Smokeless tobacco: Never  Substance and Sexual Activity   Alcohol use: Yes    Comment: 1 drink daily/ more on weekends 05/09/2018   Drug use: No   Sexual activity: Yes  Other Topics Concern   Not on file  Social  History Narrative   Not on file   Social Drivers of Health   Financial Resource Strain: Not on file  Food Insecurity: Not on file  Transportation Needs: Not on file  Physical Activity: Not on file  Stress: Not on file  Social Connections: Not on file  Intimate Partner Violence: Not on file     Outpatient Medications Prior to Visit  Medication Sig Dispense Refill   estradiol (ESTRACE) 1 MG tablet TAKE 1 TABLET(1 MG) BY MOUTH DAILY 90 tablet 0   progesterone (PROMETRIUM) 200 MG capsule TAKE 1 CAPSULE BY MOUTH AT NIGHT FOR 12 DAYS PER MONTH 36 capsule 0   RYTARY 61.25-245 MG CPCR Take 1 capsule by mouth 3 (three) times daily.     RYTARY 48.75-195 MG CPCR Take 1 capsule by mouth 3 (three) times daily.     fluconazole (DIFLUCAN) 150 MG tablet Take 1 tablet (150 mg total) by mouth daily. 2 tablet 0   predniSONE (DELTASONE) 10 MG tablet 6 tablets daily for 3 days, then reduce by 1 tablet daily until gone (Patient not taking: Reported on 01/11/2023) 33 tablet 0   triamcinolone cream (KENALOG)  0.1 % Apply 1 Application topically 2 (two) times daily. 80 g 0   No facility-administered medications prior to visit.    No Known Allergies  ROS Review of Systems  HENT:  Positive for congestion. Negative for ear pain, sinus pain, sneezing and sore throat.   Respiratory:  Positive for cough and wheezing.   Neurological:  Positive for headaches.   Negative unless indicated in HPI.    Objective:    Physical Exam  BP 110/70   Pulse 79   Temp 97.6 F (36.4 C)   Ht 5\' 6"  (1.676 m)   Wt 137 lb 6 oz (62.3 kg)   SpO2 99%   BMI 22.17 kg/m  Wt Readings from Last 3 Encounters:  09/02/23 137 lb 6 oz (62.3 kg)  01/11/23 139 lb 6.4 oz (63.2 kg)  12/02/21 141 lb 3.2 oz (64 kg)     Health Maintenance  Topic Date Due   Zoster Vaccines- Shingrix (1 of 2) Never done   INFLUENZA VACCINE  04/07/2023   COVID-19 Vaccine (3 - 2024-25 season) 05/08/2023   DTaP/Tdap/Td (2 - Td or Tdap) 05/31/2023    MAMMOGRAM  01/19/2024   Cervical Cancer Screening (HPV/Pap Cotest)  01/11/2028   Colonoscopy  07/05/2033   Hepatitis C Screening  Completed   HIV Screening  Completed   HPV VACCINES  Aged Out    There are no preventive care reminders to display for this patient.  Lab Results  Component Value Date   TSH 0.79 07/06/2022   Lab Results  Component Value Date   WBC 6.9 01/11/2023   HGB 13.1 01/11/2023   HCT 38.2 01/11/2023   MCV 97.5 01/11/2023   PLT 275.0 01/11/2023   Lab Results  Component Value Date   NA 139 01/11/2023   K 3.8 01/11/2023   CO2 29 01/11/2023   GLUCOSE 102 (H) 01/11/2023   BUN 20 01/11/2023   CREATININE 0.59 01/11/2023   BILITOT 0.3 01/11/2023   ALKPHOS 54 01/11/2023   AST 16 01/11/2023   ALT 7 01/11/2023   PROT 7.0 01/11/2023   ALBUMIN 4.3 01/11/2023   CALCIUM 9.4 01/11/2023   ANIONGAP 10 06/02/2016   GFR 97.56 01/11/2023   Lab Results  Component Value Date   CHOL 223 (H) 01/11/2023   Lab Results  Component Value Date   HDL 108.30 01/11/2023   Lab Results  Component Value Date   LDLCALC 94 01/11/2023   Lab Results  Component Value Date   TRIG 105.0 01/11/2023   Lab Results  Component Value Date   CHOLHDL 2 01/11/2023   Lab Results  Component Value Date   HGBA1C 5.8 07/06/2022      Assessment & Plan:  Generalized body aches -     POC Influenza A&B(BINAX/QUICKVUE)    Follow-up: No follow-ups on file.   Kara Dies, NP

## 2023-09-02 NOTE — Patient Instructions (Addendum)
Please go to the lab for blood work You tested negative for flu. Take the antibiotic, tesslon pereles and predinose OTC mucinex and antihistamine. Increase fluid intake, use steam and humidifier.

## 2023-09-03 LAB — COMPREHENSIVE METABOLIC PANEL
AG Ratio: 1.5 (calc) (ref 1.0–2.5)
ALT: 10 U/L (ref 6–29)
AST: 21 U/L (ref 10–35)
Albumin: 3.8 g/dL (ref 3.6–5.1)
Alkaline phosphatase (APISO): 87 U/L (ref 37–153)
BUN/Creatinine Ratio: 43 (calc) — ABNORMAL HIGH (ref 6–22)
BUN: 21 mg/dL (ref 7–25)
CO2: 26 mmol/L (ref 20–32)
Calcium: 9.2 mg/dL (ref 8.6–10.4)
Chloride: 102 mmol/L (ref 98–110)
Creat: 0.49 mg/dL — ABNORMAL LOW (ref 0.50–1.05)
Globulin: 2.6 g/dL (ref 1.9–3.7)
Glucose, Bld: 123 mg/dL — ABNORMAL HIGH (ref 65–99)
Potassium: 4 mmol/L (ref 3.5–5.3)
Sodium: 140 mmol/L (ref 135–146)
Total Bilirubin: 0.2 mg/dL (ref 0.2–1.2)
Total Protein: 6.4 g/dL (ref 6.1–8.1)

## 2023-09-03 LAB — MAGNESIUM: Magnesium: 2.1 mg/dL (ref 1.5–2.5)

## 2023-09-04 ENCOUNTER — Encounter: Payer: Self-pay | Admitting: Nurse Practitioner

## 2023-09-04 DIAGNOSIS — R252 Cramp and spasm: Secondary | ICD-10-CM | POA: Insufficient documentation

## 2023-09-04 NOTE — Assessment & Plan Note (Addendum)
Vital signs stable, slight wheezing on auscultation, patient in is no sign of acute distress, nontoxic.  Discussed findings with the patient.  Tested negative for flu. Symptoms present for about 2 weeks with no sign of resolution, will treat with Augmentin, prednisone and benzonatate. Advised to take plain Mucinex and take OTC antihistamine for PND. Incresae fluid intake and rest. Will let us know if symptoms not improving.

## 2023-09-04 NOTE — Assessment & Plan Note (Signed)
Patient reports leg cramping.  Will check magnesium and CMP

## 2023-09-05 ENCOUNTER — Other Ambulatory Visit: Payer: Self-pay | Admitting: Nurse Practitioner

## 2023-09-05 ENCOUNTER — Other Ambulatory Visit: Payer: Self-pay

## 2023-09-05 DIAGNOSIS — N289 Disorder of kidney and ureter, unspecified: Secondary | ICD-10-CM

## 2023-09-05 MED ORDER — PROGESTERONE 200 MG PO CAPS: ORAL_CAPSULE | ORAL | 0 refills | Status: DC

## 2023-09-05 NOTE — Progress Notes (Signed)
Please schedule patient for labs in 1 week.

## 2023-09-14 ENCOUNTER — Other Ambulatory Visit (INDEPENDENT_AMBULATORY_CARE_PROVIDER_SITE_OTHER): Payer: BC Managed Care – PPO

## 2023-09-14 DIAGNOSIS — N289 Disorder of kidney and ureter, unspecified: Secondary | ICD-10-CM | POA: Diagnosis not present

## 2023-09-14 LAB — RENAL FUNCTION PANEL
Albumin: 4.3 g/dL (ref 3.5–5.2)
BUN: 20 mg/dL (ref 6–23)
CO2: 32 meq/L (ref 19–32)
Calcium: 9.7 mg/dL (ref 8.4–10.5)
Chloride: 99 meq/L (ref 96–112)
Creatinine, Ser: 0.61 mg/dL (ref 0.40–1.20)
GFR: 96.32 mL/min (ref >60.00)
Glucose, Bld: 95 mg/dL (ref 70–99)
Phosphorus: 3.9 mg/dL (ref 2.3–4.6)
Potassium: 4.1 meq/L (ref 3.5–5.1)
Sodium: 138 meq/L (ref 135–145)

## 2023-10-06 ENCOUNTER — Encounter: Payer: Self-pay | Admitting: Internal Medicine

## 2023-10-11 ENCOUNTER — Encounter: Payer: Self-pay | Admitting: Internal Medicine

## 2023-10-11 ENCOUNTER — Ambulatory Visit (INDEPENDENT_AMBULATORY_CARE_PROVIDER_SITE_OTHER): Payer: BC Managed Care – PPO | Admitting: Internal Medicine

## 2023-10-11 VITALS — BP 124/82 | HR 77 | Ht 66.0 in | Wt 137.4 lb

## 2023-10-11 DIAGNOSIS — W57XXXA Bitten or stung by nonvenomous insect and other nonvenomous arthropods, initial encounter: Secondary | ICD-10-CM

## 2023-10-11 DIAGNOSIS — S40862A Insect bite (nonvenomous) of left upper arm, initial encounter: Secondary | ICD-10-CM

## 2023-10-11 DIAGNOSIS — G9331 Postviral fatigue syndrome: Secondary | ICD-10-CM | POA: Diagnosis not present

## 2023-10-11 DIAGNOSIS — S46212A Strain of muscle, fascia and tendon of other parts of biceps, left arm, initial encounter: Secondary | ICD-10-CM | POA: Diagnosis not present

## 2023-10-11 DIAGNOSIS — M791 Myalgia, unspecified site: Secondary | ICD-10-CM

## 2023-10-11 MED ORDER — PROGESTERONE 200 MG PO CAPS: ORAL_CAPSULE | ORAL | 0 refills | Status: DC

## 2023-10-11 MED ORDER — DOXYCYCLINE HYCLATE 100 MG PO TABS: 100.0000 mg | ORAL_TABLET | Freq: Two times a day (BID) | ORAL | 0 refills | Status: DC

## 2023-10-11 MED ORDER — ESTRADIOL 1 MG PO TABS: ORAL_TABLET | ORAL | 0 refills | Status: DC

## 2023-10-11 NOTE — Progress Notes (Addendum)
 Subjective:  Patient ID: Nancy Villegas, female    DOB: Aug 22, 1961  Age: 63 y.o. MRN: [id number]  CC: The primary encounter diagnosis was Myalgia. Diagnoses of Postviral fatigue syndrome, Biceps muscle strain, left, initial encounter, and Tick bite of left upper arm, initial encounter were also pertinent to this visit.   HPI Nancy Villegas presents for  Chief Complaint  Patient presents with   Arm Pain   62 YR LD FEMALE WITH PARKINSON'S DISEASE MANAGED WITH CARBIDOPA PRESENTS WIT LEFT biceps and forearm  PAIN THAT HAS BEEN PRESENT FOR THE PAST 3-4 MONTHS   she was advised in November to go to [facility].  X rays were normal.  No diagnosis made  Tick bite left medial upper arm.  Swollen and red for 2-3 days  very small deer tick.    Outpatient Medications Prior to Visit  Medication Sig Dispense Refill   RYTARY 61.25-245 MG CPCR Take 1 capsule by mouth 3 (three) times daily.     estradiol  (ESTRACE ) 1 MG tablet TAKE 1 TABLET(1 MG) BY MOUTH DAILY 90 tablet 0   progesterone  (PROMETRIUM ) 200 MG capsule TAKE 1 CAPSULE BY MOUTH AT NIGHT FOR 12 DAYS PER MONTH 36 capsule 0   amoxicillin -clavulanate (AUGMENTIN ) 875-125 MG tablet Take 1 tablet by mouth 2 (two) times daily. (Patient not taking: Reported on 10/11/2023) 20 tablet 0   benzonatate  (TESSALON  PERLES) 100 MG capsule Take 1 capsule (100 mg total) by mouth 3 (three) times daily as needed. (Patient not taking: Reported on 10/11/2023) 30 capsule 0   predniSONE  (DELTASONE ) 20 MG tablet Take 2 tablets (40 mg total) by mouth daily with breakfast. (Patient not taking: Reported on 10/11/2023) 10 tablet 0   No facility-administered medications prior to visit.    Review of Systems;  Patient denies headache, fevers, malaise, unintentional weight loss, skin rash, eye pain, sinus congestion and sinus pain, sore throat, dysphagia,  hemoptysis , cough, dyspnea, wheezing, chest pain, palpitations, orthopnea, edema, abdominal pain, nausea, melena, diarrhea,  constipation, flank pain, dysuria, hematuria, urinary  Frequency, nocturia, numbness, tingling, seizures,  Focal weakness, Loss of consciousness,  Tremor, insomnia, depression, anxiety, and suicidal ideation.      Objective:  BP 124/82   Pulse 77   Ht 5' 6 (1.676 m)   Wt 137 lb 6.4 oz (62.3 kg)   SpO2 99%   BMI 22.18 kg/m   BP Readings from Last 3 Encounters:  10/11/23 124/82  09/02/23 110/70  01/11/23 122/78    Wt Readings from Last 3 Encounters:  10/11/23 137 lb 6.4 oz (62.3 kg)  09/02/23 137 lb 6 oz (62.3 kg)  01/11/23 139 lb 6.4 oz (63.2 kg)    Physical Exam Vitals reviewed.  Constitutional:      General: She is not in acute distress.    Appearance: Normal appearance. She is normal weight. She is not ill-appearing, toxic-appearing or diaphoretic.  HENT:     Head: Normocephalic.  Eyes:     General: No scleral icterus.       Right eye: No discharge.        Left eye: No discharge.     Conjunctiva/sclera: Conjunctivae normal.  Cardiovascular:     Rate and Rhythm: Normal rate and regular rhythm.     Heart sounds: Normal heart sounds.  Pulmonary:     Effort: Pulmonary effort is normal. No respiratory distress.     Breath sounds: Normal breath sounds.  Musculoskeletal:        General:  Normal range of motion.     Right upper arm: No swelling.     Left upper arm: Tenderness present.       Arms:     Comments: Mild swelling of biceps muscle,  tender to palpation at insertion point  on epicondyle  Skin:    General: Skin is warm and dry.     Findings: Wound present.     Comments: 1 mm wound/insect bite  Neurological:     General: No focal deficit present.     Mental Status: She is alert and oriented to person, place, and time. Mental status is at baseline.  Psychiatric:        Mood and Affect: Mood normal.        Behavior: Behavior normal.        Thought Content: Thought content normal.        Judgment: Judgment normal.    Lab Results  Component Value Date    HGBA1C 5.8 07/06/2022   HGBA1C 5.5 08/12/2021    Lab Results  Component Value Date   CREATININE 0.60 10/11/2023   CREATININE 0.61 09/14/2023   CREATININE 0.49 (L) 09/02/2023    Lab Results  Component Value Date   WBC 5.3 10/11/2023   HGB 12.4 10/11/2023   HCT 36.6 10/11/2023   PLT 306.0 10/11/2023   GLUCOSE 110 (H) 10/11/2023   CHOL 223 (H) 01/11/2023   TRIG 105.0 01/11/2023   HDL 108.30 01/11/2023   LDLDIRECT 94.0 01/11/2023   LDLCALC 94 01/11/2023   ALT 6 10/11/2023   AST 19 10/11/2023   NA 138 10/11/2023   K 3.7 10/11/2023   CL 101 10/11/2023   CREATININE 0.60 10/11/2023   BUN 24 (H) 10/11/2023   CO2 27 10/11/2023   TSH 0.79 07/06/2022   HGBA1C 5.8 07/06/2022    MM DIAG BREAST TOMO BILATERAL Result Date: 01/19/2023 CLINICAL DATA:  One year follow-up for asymmetry calcifications Patient was initially callback from screening for evaluation of possible distortion and calcifications in the LEFT breast on 12/31/2021. EXAM: DIGITAL DIAGNOSTIC BILATERAL MAMMOGRAM WITH TOMOSYNTHESIS TECHNIQUE: Bilateral digital diagnostic mammography and breast tomosynthesis was performed. COMPARISON:  Previous exam(s). ACR Breast Density Category d: The breasts are extremely dense, which lowers the sensitivity of mammography. FINDINGS: RIGHT breast is negative. There is no distortion in the LEFT breast. Magnified views are performed of calcifications in the central portion of the LEFT breast. These views demonstrate scattered group of round calcifications without suspicious morphology or distribution. The appearance is stable. IMPRESSION: 1.  No mammographic evidence for malignancy. 2. Stable, probably benign calcifications in the LEFT breast. RECOMMENDATION: Bilateral diagnostic mammogram in 1 year I have discussed the findings and recommendations with the patient. If applicable, a reminder letter will be sent to the patient regarding the next appointment. BI-RADS CATEGORY  3: Probably benign.  Electronically Signed   By: Almarie Daring M.D.   On: 01/19/2023 11:11   Assessment & Plan:  .Myalgia -     CK -     Sedimentation rate -     C-reactive protein -     Comprehensive metabolic panel  Postviral fatigue syndrome -     CBC with Differential/Platelet  Biceps muscle strain, left, initial encounter Assessment & Plan: She has persistent pain with use of biceps , left arm. Ruling out myositis , refer to sports medicine if negative   Orders: -     Ambulatory referral to Sports Medicine  Tick bite of left upper arm, initial  encounter Assessment & Plan: Treating with doxycycline     Other orders -     Estradiol ; TAKE 1 TABLET(1 MG) BY MOUTH DAILY  Dispense: 90 tablet; Refill: 0 -     Progesterone ; TAKE 1 CAPSULE BY MOUTH AT NIGHT FOR 12 DAYS PER MONTH  Dispense: 36 capsule; Refill: 0 -     Doxycycline  Hyclate; Take 1 tablet (100 mg total) by mouth 2 (two) times daily.  Dispense: 14 tablet; Refill: 0    Follow-up: No follow-ups on file.   Nancy LITTIE Kettering, MD

## 2023-10-11 NOTE — Assessment & Plan Note (Addendum)
She has persistent pain with use of biceps , left arm. Ruling out myositis , refer to sports medicine if negative

## 2023-10-11 NOTE — Patient Instructions (Signed)
Please take the doxycycline twice daily WITH A FULL MEAL for one week  Labs today to look for signs of muscle irritation  If they are normal  referral to sports medicine will be made  If not normal,  rheumatology referral will be made

## 2023-10-11 NOTE — Assessment & Plan Note (Signed)
Treating with doxycycline. 

## 2023-10-12 ENCOUNTER — Encounter: Payer: Self-pay | Admitting: Internal Medicine

## 2023-10-12 LAB — COMPREHENSIVE METABOLIC PANEL
ALT: 6 U/L (ref 0–35)
AST: 19 U/L (ref 0–37)
Albumin: 4.3 g/dL (ref 3.5–5.2)
Alkaline Phosphatase: 60 U/L (ref 39–117)
BUN: 24 mg/dL — ABNORMAL HIGH (ref 6–23)
CO2: 27 meq/L (ref 19–32)
Calcium: 9.5 mg/dL (ref 8.4–10.5)
Chloride: 101 meq/L (ref 96–112)
Creatinine, Ser: 0.6 mg/dL (ref 0.40–1.20)
GFR: 96.65 mL/min (ref >60.00)
Glucose, Bld: 110 mg/dL — ABNORMAL HIGH (ref 70–99)
Potassium: 3.7 meq/L (ref 3.5–5.1)
Sodium: 138 meq/L (ref 135–145)
Total Bilirubin: 0.3 mg/dL (ref 0.2–1.2)
Total Protein: 7.3 g/dL (ref 6.0–8.3)

## 2023-10-12 LAB — CBC WITH DIFFERENTIAL/PLATELET
Basophils Absolute: 0.1 10*3/uL (ref 0.0–0.1)
Basophils Relative: 1.5 % (ref 0.0–3.0)
Eosinophils Absolute: 0.2 10*3/uL (ref 0.0–0.7)
Eosinophils Relative: 3.6 % (ref 0.0–5.0)
HCT: 36.6 % (ref 36.0–46.0)
Hemoglobin: 12.4 g/dL (ref 12.0–15.0)
Lymphocytes Relative: 31.6 % (ref 12.0–46.0)
Lymphs Abs: 1.7 10*3/uL (ref 0.7–4.0)
MCHC: 33.8 g/dL (ref 30.0–36.0)
MCV: 96.2 fL (ref 78.0–100.0)
Monocytes Absolute: 0.5 10*3/uL (ref 0.1–1.0)
Monocytes Relative: 9.1 % (ref 3.0–12.0)
Neutro Abs: 2.9 10*3/uL (ref 1.4–7.7)
Neutrophils Relative %: 54.2 % (ref 43.0–77.0)
Platelets: 306 10*3/uL (ref 150.0–400.0)
RBC: 3.81 Mil/uL — ABNORMAL LOW (ref 3.87–5.11)
RDW: 13.5 % (ref 11.5–15.5)
WBC: 5.3 10*3/uL (ref 4.0–10.5)

## 2023-10-12 LAB — CK: Total CK: 109 U/L (ref 7–177)

## 2023-10-12 LAB — C-REACTIVE PROTEIN: CRP: <1 mg/dL (ref 0.5–20.0)

## 2023-10-12 LAB — SEDIMENTATION RATE: Sed Rate: 22 mm/h (ref 0–30)

## 2023-10-12 NOTE — Addendum Note (Signed)
 Addended by: Thersia Flax on: 10/12/2023 08:45 PM   Modules accepted: Orders

## 2023-10-17 ENCOUNTER — Encounter: Payer: Self-pay | Admitting: Internal Medicine

## 2023-10-18 ENCOUNTER — Ambulatory Visit: Payer: BC Managed Care – PPO | Admitting: Family Medicine

## 2023-10-18 NOTE — Addendum Note (Signed)
Addended by: Sherlene Shams on: 10/18/2023 02:38 PM   Modules accepted: Orders

## 2023-10-18 NOTE — Progress Notes (Deleted)
   Rubin Payor, PhD, LAT, ATC acting as a scribe for Clementeen Graham, MD.  Nancy Villegas is a 63 y.o. female who presents to [facility] today for L upper arm pain x ***. Pt locates pain to ***  Aggravates: Treatments tried:  Pertinent review of systems: ***  Relevant historical information: ***   Exam:  There were no vitals taken for this visit. General: Well Developed, well nourished, and in no acute distress.   MSK: ***    Lab and Radiology Results No results found for this or any previous visit (from the past 72 hours). No results found.     Assessment and Plan: 63 y.o. female with ***   PDMP not reviewed this encounter. No orders of the defined types were placed in this encounter.  No orders of the defined types were placed in this encounter.    Discussed warning signs or symptoms. Please see discharge instructions. Patient expresses understanding.   ***

## 2023-10-19 ENCOUNTER — Other Ambulatory Visit: Payer: Self-pay | Admitting: Internal Medicine

## 2023-10-19 DIAGNOSIS — S46212A Strain of muscle, fascia and tendon of other parts of biceps, left arm, initial encounter: Secondary | ICD-10-CM

## 2023-10-20 DIAGNOSIS — M62838 Other muscle spasm: Secondary | ICD-10-CM | POA: Diagnosis not present

## 2023-10-20 DIAGNOSIS — M7522 Bicipital tendinitis, left shoulder: Secondary | ICD-10-CM | POA: Diagnosis not present

## 2023-10-20 DIAGNOSIS — M79602 Pain in left arm: Secondary | ICD-10-CM | POA: Diagnosis not present

## 2023-10-25 ENCOUNTER — Ambulatory Visit: Payer: BC Managed Care – PPO | Admitting: Internal Medicine

## 2023-11-10 DIAGNOSIS — G20A1 Parkinson's disease without dyskinesia, without mention of fluctuations: Secondary | ICD-10-CM | POA: Diagnosis not present

## 2023-11-24 DIAGNOSIS — M25522 Pain in left elbow: Secondary | ICD-10-CM | POA: Diagnosis not present

## 2023-11-24 DIAGNOSIS — M25512 Pain in left shoulder: Secondary | ICD-10-CM | POA: Diagnosis not present

## 2023-11-28 DIAGNOSIS — M25522 Pain in left elbow: Secondary | ICD-10-CM | POA: Diagnosis not present

## 2023-11-28 DIAGNOSIS — M25512 Pain in left shoulder: Secondary | ICD-10-CM | POA: Diagnosis not present

## 2023-11-30 DIAGNOSIS — M25522 Pain in left elbow: Secondary | ICD-10-CM | POA: Diagnosis not present

## 2023-11-30 DIAGNOSIS — M25512 Pain in left shoulder: Secondary | ICD-10-CM | POA: Diagnosis not present

## 2023-12-08 DIAGNOSIS — M25512 Pain in left shoulder: Secondary | ICD-10-CM | POA: Diagnosis not present

## 2023-12-08 DIAGNOSIS — M25522 Pain in left elbow: Secondary | ICD-10-CM | POA: Diagnosis not present

## 2023-12-13 DIAGNOSIS — M25522 Pain in left elbow: Secondary | ICD-10-CM | POA: Diagnosis not present

## 2023-12-13 DIAGNOSIS — M25512 Pain in left shoulder: Secondary | ICD-10-CM | POA: Diagnosis not present

## 2023-12-15 ENCOUNTER — Telehealth: Payer: Self-pay

## 2023-12-15 DIAGNOSIS — M25522 Pain in left elbow: Secondary | ICD-10-CM | POA: Diagnosis not present

## 2023-12-15 DIAGNOSIS — M25512 Pain in left shoulder: Secondary | ICD-10-CM | POA: Diagnosis not present

## 2023-12-15 MED ORDER — ESTRADIOL 1 MG PO TABS: ORAL_TABLET | ORAL | 0 refills | Status: DC

## 2023-12-15 NOTE — Telephone Encounter (Signed)
 Spoke with pharmacy and they stated that they only filled the rx for a 30 day supply so pt is completely out of her medication. I have refilled the medication. Pt is aware and aware that she needs to call back to schedule an appt with Dr. Darrick Huntsman.

## 2023-12-15 NOTE — Telephone Encounter (Signed)
 Copied from CRM [id number]. Topic: Clinical - Medication Question >> Dec 15, 2023  1:29 PM Almira Coaster wrote: Reason for CRM: Patient is calling to follow up on a refill request that her pharmacy submitted for her estradiol (ESTRACE) 1 MG tablet, she followed up and they advised they have not heard back and she is almost out.

## 2023-12-15 NOTE — Addendum Note (Signed)
 Addended by: Sandy Salaam on: 12/15/2023 02:22 PM   Modules accepted: Orders

## 2023-12-28 ENCOUNTER — Other Ambulatory Visit: Payer: Self-pay | Admitting: Internal Medicine

## 2023-12-28 DIAGNOSIS — M25512 Pain in left shoulder: Secondary | ICD-10-CM | POA: Diagnosis not present

## 2023-12-28 DIAGNOSIS — R921 Mammographic calcification found on diagnostic imaging of breast: Secondary | ICD-10-CM

## 2023-12-28 DIAGNOSIS — M25522 Pain in left elbow: Secondary | ICD-10-CM | POA: Diagnosis not present

## 2024-01-02 DIAGNOSIS — M25522 Pain in left elbow: Secondary | ICD-10-CM | POA: Diagnosis not present

## 2024-01-02 DIAGNOSIS — M25512 Pain in left shoulder: Secondary | ICD-10-CM | POA: Diagnosis not present

## 2024-01-04 DIAGNOSIS — M25512 Pain in left shoulder: Secondary | ICD-10-CM | POA: Diagnosis not present

## 2024-01-04 DIAGNOSIS — M25522 Pain in left elbow: Secondary | ICD-10-CM | POA: Diagnosis not present

## 2024-01-09 DIAGNOSIS — M25522 Pain in left elbow: Secondary | ICD-10-CM | POA: Diagnosis not present

## 2024-01-09 DIAGNOSIS — M25512 Pain in left shoulder: Secondary | ICD-10-CM | POA: Diagnosis not present

## 2024-01-12 DIAGNOSIS — M25522 Pain in left elbow: Secondary | ICD-10-CM | POA: Diagnosis not present

## 2024-01-12 DIAGNOSIS — M25512 Pain in left shoulder: Secondary | ICD-10-CM | POA: Diagnosis not present

## 2024-01-18 ENCOUNTER — Other Ambulatory Visit: Payer: Self-pay

## 2024-01-18 MED ORDER — PROGESTERONE 200 MG PO CAPS: ORAL_CAPSULE | ORAL | 1 refills | Status: DC

## 2024-01-19 DIAGNOSIS — M25522 Pain in left elbow: Secondary | ICD-10-CM | POA: Diagnosis not present

## 2024-01-19 DIAGNOSIS — M25512 Pain in left shoulder: Secondary | ICD-10-CM | POA: Diagnosis not present

## 2024-01-23 DIAGNOSIS — M25512 Pain in left shoulder: Secondary | ICD-10-CM | POA: Diagnosis not present

## 2024-01-23 DIAGNOSIS — M25522 Pain in left elbow: Secondary | ICD-10-CM | POA: Diagnosis not present

## 2024-01-25 ENCOUNTER — Encounter

## 2024-02-02 DIAGNOSIS — M25512 Pain in left shoulder: Secondary | ICD-10-CM | POA: Diagnosis not present

## 2024-02-02 DIAGNOSIS — M25522 Pain in left elbow: Secondary | ICD-10-CM | POA: Diagnosis not present

## 2024-02-14 ENCOUNTER — Encounter

## 2024-02-16 ENCOUNTER — Ambulatory Visit (INDEPENDENT_AMBULATORY_CARE_PROVIDER_SITE_OTHER): Admitting: Internal Medicine

## 2024-02-16 ENCOUNTER — Encounter: Payer: Self-pay | Admitting: Internal Medicine

## 2024-02-16 VITALS — BP 116/76 | HR 70 | Temp 97.9°F | Ht 66.0 in | Wt 135.8 lb

## 2024-02-16 DIAGNOSIS — G9331 Postviral fatigue syndrome: Secondary | ICD-10-CM | POA: Diagnosis not present

## 2024-02-16 DIAGNOSIS — M65222 Calcific tendinitis, left upper arm: Secondary | ICD-10-CM

## 2024-02-16 DIAGNOSIS — E785 Hyperlipidemia, unspecified: Secondary | ICD-10-CM

## 2024-02-16 DIAGNOSIS — R7301 Impaired fasting glucose: Secondary | ICD-10-CM | POA: Diagnosis not present

## 2024-02-16 DIAGNOSIS — M791 Myalgia, unspecified site: Secondary | ICD-10-CM | POA: Diagnosis not present

## 2024-02-16 DIAGNOSIS — Z0000 Encounter for general adult medical examination without abnormal findings: Secondary | ICD-10-CM | POA: Diagnosis not present

## 2024-02-16 DIAGNOSIS — Z23 Encounter for immunization: Secondary | ICD-10-CM

## 2024-02-16 DIAGNOSIS — G20B2 Parkinson's disease with dyskinesia, with fluctuations: Secondary | ICD-10-CM

## 2024-02-16 MED ORDER — DOXYCYCLINE HYCLATE 100 MG PO TABS: 100.0000 mg | ORAL_TABLET | Freq: Two times a day (BID) | ORAL | 0 refills | Status: AC

## 2024-02-16 MED ORDER — PROGESTERONE 200 MG PO CAPS: ORAL_CAPSULE | ORAL | 1 refills | Status: DC

## 2024-02-16 MED ORDER — ESTRADIOL 1 MG PO TABS: ORAL_TABLET | ORAL | 0 refills | Status: DC

## 2024-02-16 NOTE — Progress Notes (Signed)
 Patient ID: Nancy Villegas, female    DOB: 1961/04/30  Age: 63 y.o. MRN: [id number]  The patient is here for annual preventive examination and management of other chronic and acute problems.   The risk factors are reflected in the social history.   The roster of all physicians providing medical care to patient - is listed in the Snapshot section of the chart.   Activities of daily living:  The patient is 100% independent in all ADLs: dressing, toileting, feeding as well as independent mobility   Home safety : The patient has smoke detectors in the home. They wear seatbelts.  There are no unsecured firearms at home. There is no violence in the home.    There is no risks for hepatitis, STDs or HIV. There is no   history of blood transfusion. They have no travel history to infectious disease endemic areas of the world.   The patient has seen their dentist in the last six month. They have seen their eye doctor in the last year. The patinet  denies slight hearing difficulty with regard to whispered voices and some television programs.  They have deferred audiologic testing in the last year.  They do not  have excessive sun exposure. Discussed the need for sun protection: hats, long sleeves and use of sunscreen if there is significant sun exposure.    Diet: the importance of a healthy diet is discussed. They do have a healthy diet.   The benefits of regular aerobic exercise were discussed. The patient  exercises  3 to 5 days per week  for  60 minutes.    Depression screen: there are no signs or vegative symptoms of depression- irritability, change in appetite, anhedonia, sadness/tearfullness.   The following portions of the patient's history were reviewed and updated as appropriate: allergies, current medications, past family history, past medical history,  past surgical history, past social history  and problem list.   Visual acuity was not assessed per patient preference since the patient has  regular follow up with an  ophthalmologist. Hearing and body mass index were assessed and reviewed.    During the course of the visit the patient was educated and counseled about appropriate screening and preventive services including : fall prevention , diabetes screening, nutrition counseling, colorectal cancer screening, and recommended immunizations.    Chief Complaint:    1) idiopajhic Parksinson;s   neurology increased her dose of Rytary in  March per review of note   2) left forearm pain has returned.  Improved with tendonitis treatment involving dry needling    Review of Symptoms  Patient denies headache, fevers, malaise, unintentional weight loss, skin rash, eye pain, sinus congestion and sinus pain, sore throat, dysphagia,  hemoptysis , cough, dyspnea, wheezing, chest pain, palpitations, orthopnea, edema, abdominal pain, nausea, melena, diarrhea, constipation, flank pain, dysuria, hematuria, urinary  Frequency, nocturia, numbness, tingling, seizures,  Focal weakness, Loss of consciousness,  Tremor, insomnia, depression, anxiety, and suicidal ideation.    Physical Exam:  BP 116/76   Pulse 70   Temp 97.9 F (36.6 C)   Ht 5' 6 (1.676 m)   Wt 135 lb 12.8 oz (61.6 kg)   SpO2 98%   BMI 21.92 kg/m    Physical Exam Vitals reviewed.  Constitutional:      General: She is not in acute distress.    Appearance: Normal appearance. She is well-developed and normal weight. She is not ill-appearing, toxic-appearing or diaphoretic.  HENT:     Head:  Normocephalic.     Right Ear: Tympanic membrane, ear canal and external ear normal. There is no impacted cerumen.     Left Ear: Tympanic membrane, ear canal and external ear normal. There is no impacted cerumen.     Nose: Nose normal.     Mouth/Throat:     Mouth: Mucous membranes are moist.     Pharynx: Oropharynx is clear.   Eyes:     General: No scleral icterus.       Right eye: No discharge.        Left eye: No discharge.      Conjunctiva/sclera: Conjunctivae normal.     Pupils: Pupils are equal, round, and reactive to light.   Neck:     Thyroid : No thyromegaly.     Vascular: No carotid bruit or JVD.   Cardiovascular:     Rate and Rhythm: Normal rate and regular rhythm.     Heart sounds: Normal heart sounds.  Pulmonary:     Effort: Pulmonary effort is normal. No respiratory distress.     Breath sounds: Normal breath sounds.  Chest:  Breasts:    Breasts are symmetrical.     Right: Normal. No swelling, inverted nipple, mass, nipple discharge, skin change or tenderness.     Left: Normal. No swelling, inverted nipple, mass, nipple discharge, skin change or tenderness.  Abdominal:     General: Bowel sounds are normal.     Palpations: Abdomen is soft. There is no mass.     Tenderness: There is no abdominal tenderness. There is no guarding or rebound.   Musculoskeletal:        General: Normal range of motion.     Cervical back: Normal range of motion and neck supple.  Lymphadenopathy:     Cervical: No cervical adenopathy.     Upper Body:     Right upper body: No supraclavicular, axillary or pectoral adenopathy.     Left upper body: No supraclavicular, axillary or pectoral adenopathy.   Skin:    General: Skin is warm and dry.   Neurological:     General: No focal deficit present.     Mental Status: She is alert and oriented to person, place, and time. Mental status is at baseline.   Psychiatric:        Mood and Affect: Mood normal.        Behavior: Behavior normal.        Thought Content: Thought content normal.        Judgment: Judgment normal.    Assessment and Plan: Calcific tendonitis of upper arm, left -     Ambulatory referral to Physical Therapy; Future  Impaired fasting glucose -     Hemoglobin A1c; Future  Myalgia -     Comprehensive metabolic panel with GFR; Future  Postviral fatigue syndrome -     CBC with Differential/Platelet; Future  Hyperlipidemia LDL goal <160 -     Lipid  panel; Future  Need for Tdap vaccination -     Tdap vaccine greater than or equal to 7yo IM  Encounter for preventive health examination Assessment & Plan: age appropriate education and counseling updated, referrals for preventative services and immunizations addressed, dietary and smoking counseling addressed, most recent labs reviewed.  I have personally reviewed and have noted:   1) the patient's medical and social history 2) The pt's use of alcohol, tobacco, and illicit drugs 3) The patient's current medications and supplements 4) Functional ability including ADL's, fall risk, home  safety risk, hearing and visual impairment 5) Diet and physical activities 6) Evidence for depression or mood disorder 7) The patient's height, weight, and BMI have been recorded in the chartrhythm.     I have made referrals, and provided counseling and education based on review of the above    Parkinson's disease with dyskinesia and fluctuating manifestations (HCC) Assessment & Plan: Stiffness is improving. She is playing pickle ball and working out at [employer] She continues to remain discreet about her condition ;  her daughters remain unaware of her disease t.      Has developed a slight tremor of left hand and left ankle  Reviewed use of sinemet, use is limited by sedating effects.     Other orders -     Estradiol ; TAKE 1 TABLET(1 MG) BY MOUTH DAILY  Dispense: 90 tablet; Refill: 0 -     Progesterone ; TAKE 1 CAPSULE BY MOUTH AT NIGHT FOR 12 DAYS PER MONTH  Dispense: 36 capsule; Refill: 1 -     Doxycycline  Hyclate; Take 1 tablet (100 mg total) by mouth 2 (two) times daily.  Dispense: 14 tablet; Refill: 0    No follow-ups on file.  Thersia Flax, MD

## 2024-02-16 NOTE — Patient Instructions (Addendum)
 You received your TDaP vaccine today (due every 10 years)   [facility] Fever  can present with fever and a headache, NOT a rash; s the occurrence of these symptoms during spring/summer/fall in the context of recent tick exposure is RMSF until proven otherwise and should be treated immediately with doxycycline .  I have sent this rx to your pharmacy to use if you develop these symptoms.  Let me know if this occurs so we can set you up for the appropriate testing .

## 2024-02-18 NOTE — Assessment & Plan Note (Signed)
 Stiffness is improving. She is playing pickle ball and working out at [employer] She continues to remain discreet about her condition ;  her daughters remain unaware of her disease t.      Has developed a slight tremor of left hand and left ankle  Reviewed use of sinemet, use is limited by sedating effects.

## 2024-02-18 NOTE — Assessment & Plan Note (Signed)

## 2024-02-21 ENCOUNTER — Other Ambulatory Visit: Payer: Self-pay | Admitting: Internal Medicine

## 2024-03-01 ENCOUNTER — Ambulatory Visit
Admission: RE | Admit: 2024-03-01 | Discharge: 2024-03-01 | Disposition: A | Discharge status: Home / Self Care | Source: Ambulatory Visit | Attending: Internal Medicine | Admitting: Internal Medicine

## 2024-03-01 DIAGNOSIS — R928 Other abnormal and inconclusive findings on diagnostic imaging of breast: Secondary | ICD-10-CM | POA: Diagnosis not present

## 2024-03-01 DIAGNOSIS — R921 Mammographic calcification found on diagnostic imaging of breast: Secondary | ICD-10-CM

## 2024-05-02 DIAGNOSIS — L820 Inflamed seborrheic keratosis: Secondary | ICD-10-CM | POA: Diagnosis not present

## 2024-05-02 DIAGNOSIS — D225 Melanocytic nevi of trunk: Secondary | ICD-10-CM | POA: Diagnosis not present

## 2024-05-02 DIAGNOSIS — R208 Other disturbances of skin sensation: Secondary | ICD-10-CM | POA: Diagnosis not present

## 2024-05-02 DIAGNOSIS — D2262 Melanocytic nevi of left upper limb, including shoulder: Secondary | ICD-10-CM | POA: Diagnosis not present

## 2024-05-02 DIAGNOSIS — D2272 Melanocytic nevi of left lower limb, including hip: Secondary | ICD-10-CM | POA: Diagnosis not present

## 2024-05-02 DIAGNOSIS — D2261 Melanocytic nevi of right upper limb, including shoulder: Secondary | ICD-10-CM | POA: Diagnosis not present

## 2024-05-14 DIAGNOSIS — G20A1 Parkinson's disease without dyskinesia, without mention of fluctuations: Secondary | ICD-10-CM | POA: Diagnosis not present

## 2024-06-18 ENCOUNTER — Other Ambulatory Visit: Payer: Self-pay | Admitting: Internal Medicine

## 2024-06-18 NOTE — Telephone Encounter (Unsigned)
 Copied from CRM [id number]. Topic: Clinical - Medication Refill >> Jun 18, 2024 12:55 PM Shereese L wrote: Medication: estradiol  (ESTRACE ) 1 MG tablet  Has the patient contacted their pharmacy? Yes (Agent: If no, request that the patient contact the pharmacy for the refill. If patient does not wish to contact the pharmacy document the reason why and proceed with request.) (Agent: If yes, when and what did the pharmacy advise?)  This is the patient's preferred pharmacy:  [id number] [place], [place] - [address] AT [facility] OF [address] [place] [address] Phone: [phone] Fax: [phone] Is this the correct pharmacy for this prescription? Yes If no, delete pharmacy and type the correct one.   Has the prescription been filled recently? Yes  Is the patient out of the medication? Yes  Has the patient been seen for an appointment in the last year OR does the patient have an upcoming appointment? Yes  Can we respond through MyChart? Yes  Agent: Please be advised that Rx refills may take up to 3 business days. We ask that you follow-up with your pharmacy.

## 2024-06-20 MED ORDER — ESTRADIOL 1 MG PO TABS: ORAL_TABLET | ORAL | 0 refills | Status: DC

## 2024-07-11 ENCOUNTER — Other Ambulatory Visit: Payer: Self-pay

## 2024-07-11 MED ORDER — ESTRADIOL 1 MG PO TABS: ORAL_TABLET | ORAL | 1 refills | Status: DC

## 2024-07-13 ENCOUNTER — Other Ambulatory Visit: Payer: Self-pay

## 2024-07-13 MED ORDER — ESTRADIOL 1 MG PO TABS: ORAL_TABLET | ORAL | 1 refills | Status: AC

## 2024-07-13 MED ORDER — PROGESTERONE 200 MG PO CAPS: ORAL_CAPSULE | ORAL | 1 refills | Status: AC

## 2024-09-07 ENCOUNTER — Encounter: Payer: Self-pay | Admitting: Internal Medicine

## 2024-09-28 ENCOUNTER — Ambulatory Visit: Payer: Self-pay

## 2024-09-28 ENCOUNTER — Other Ambulatory Visit: Payer: Self-pay | Admitting: Internal Medicine

## 2024-09-28 MED ORDER — FLUCONAZOLE 150 MG PO TABS: 150.0000 mg | ORAL_TABLET | Freq: Every day | ORAL | 0 refills | Status: AC

## 2024-09-28 MED ORDER — FLUCONAZOLE 150 MG PO TABS: 150.0000 mg | ORAL_TABLET | Freq: Every day | ORAL | 0 refills | Status: DC

## 2024-09-28 NOTE — Telephone Encounter (Signed)
" ° ° °  Message from Brittany M sent at 09/28/2024  8:14 AM EST  Reason for Triage: Patient is out of town on vacation right now. She has a yeast infection and will not be home until tomorrow- Wanting to know if something can be called in before she gets home.   "

## 2024-09-28 NOTE — Telephone Encounter (Deleted)
 Per previous note, call transferred to office. Routing encounter to office for follow-up.

## 2024-09-28 NOTE — Telephone Encounter (Signed)
 Patient returning call. Advised specialist nurse was calling from office to transfer there.

## 2024-09-28 NOTE — Addendum Note (Signed)
 Addended by: MARYLYNN VERNEITA CROME on: 09/28/2024 12:17 PM   Modules accepted: Orders

## 2024-09-28 NOTE — Telephone Encounter (Signed)
 Returned call to patient to follow up to gather more information. [place] Patient states she is wondering if Dr Marylynn would send her something, as she has been wearing a lot of bathing suits and got a yeast infection. Patient states she is about to head to the airport for a four hour flight and she would not be home until midnight, but she wanted to know if provider would send her something. Please advise?

## 2024-09-28 NOTE — Telephone Encounter (Unsigned)
 Copied from CRM [id number]. Topic: General - Other >> Sep 28, 2024  9:29 AM Tonda B wrote: Reason for CRM: patient missed call from [facility] please call pt back [phone]
